# Patient Record
Sex: Female | Born: 1935 | Race: White | Hispanic: No
Health system: Southern US, Community
[De-identification: ages and names within clinical notes are randomized; demographics above are authoritative.]

---

## 2003-10-19 ENCOUNTER — Ambulatory Visit: Payer: Self-pay | Admitting: Internal Medicine

## 2004-08-21 ENCOUNTER — Emergency Department: Payer: Self-pay | Admitting: Emergency Medicine

## 2004-08-22 ENCOUNTER — Ambulatory Visit: Payer: Self-pay | Admitting: Emergency Medicine

## 2004-08-24 ENCOUNTER — Emergency Department: Payer: Self-pay | Admitting: Emergency Medicine

## 2004-09-04 ENCOUNTER — Ambulatory Visit: Payer: Self-pay | Admitting: Internal Medicine

## 2004-12-13 ENCOUNTER — Ambulatory Visit: Payer: Self-pay

## 2004-12-25 ENCOUNTER — Ambulatory Visit: Payer: Self-pay

## 2005-01-21 ENCOUNTER — Ambulatory Visit: Payer: Self-pay | Admitting: Gastroenterology

## 2005-04-25 ENCOUNTER — Ambulatory Visit: Payer: Self-pay | Admitting: Gastroenterology

## 2005-08-02 ENCOUNTER — Inpatient Hospital Stay: Payer: Self-pay | Admitting: Internal Medicine

## 2005-09-02 ENCOUNTER — Emergency Department: Payer: Self-pay | Admitting: Emergency Medicine

## 2006-08-24 IMAGING — CR DG CHEST 1V PORT
1 series · 1 of 1 positions shown · non-contrast
Comparison: none

REASON FOR EXAM: Chest pain  rm 15
COMMENTS:

PROCEDURE:     DXR - DXR PORTABLE CHEST SINGLE VIEW  - September 02, 2005 [DATE]
RESULT:         AP view of the chest shows the lung fields to be clear.  The
heart, mediastinal and osseous structures reveal no significant
abnormalities.

[view not recorded]
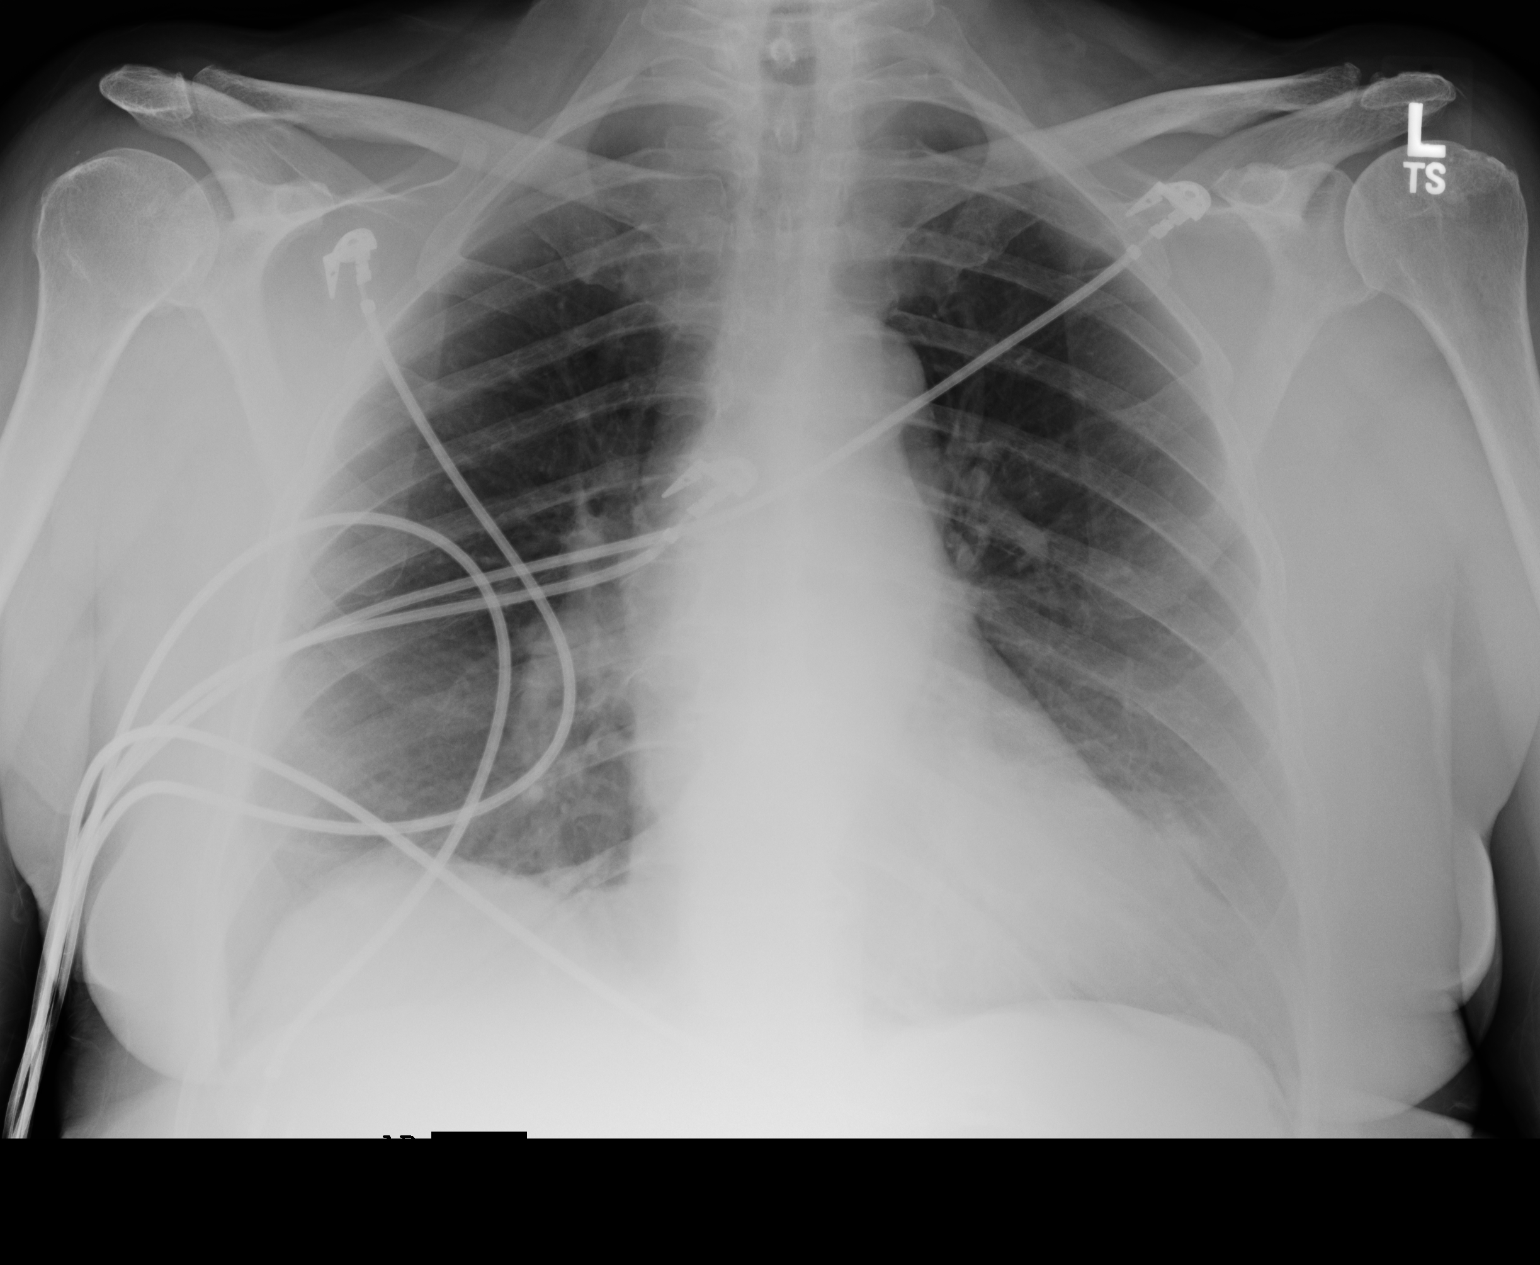

[1 of 1 positions shown; findings below may reference images not displayed]

IMPRESSION: No acute changes are identified.

## 2007-06-11 ENCOUNTER — Ambulatory Visit: Payer: Self-pay | Admitting: Internal Medicine

## 2007-08-09 ENCOUNTER — Emergency Department: Payer: Self-pay | Admitting: Internal Medicine

## 2008-07-13 ENCOUNTER — Ambulatory Visit: Payer: Self-pay | Admitting: Internal Medicine

## 2008-07-14 ENCOUNTER — Ambulatory Visit: Payer: Self-pay | Admitting: Internal Medicine

## 2008-08-05 ENCOUNTER — Ambulatory Visit: Payer: Self-pay | Admitting: Internal Medicine

## 2008-08-14 ENCOUNTER — Ambulatory Visit: Payer: Self-pay | Admitting: Internal Medicine

## 2008-09-23 ENCOUNTER — Ambulatory Visit: Payer: Self-pay | Admitting: Nephrology

## 2009-01-02 ENCOUNTER — Ambulatory Visit: Payer: Self-pay | Admitting: Internal Medicine

## 2009-01-11 ENCOUNTER — Ambulatory Visit: Payer: Self-pay | Admitting: Orthopedic Surgery

## 2009-03-30 ENCOUNTER — Ambulatory Visit: Payer: Self-pay | Admitting: Internal Medicine

## 2009-09-14 IMAGING — US US RENAL KIDNEY
1 series · 17 of 25 positions shown · non-contrast
Comparison: none

REASON FOR EXAM: ckd 4
COMMENTS:

[Series 1: us renal kidney · 17 of 36 slices shown]
[im 1/36]
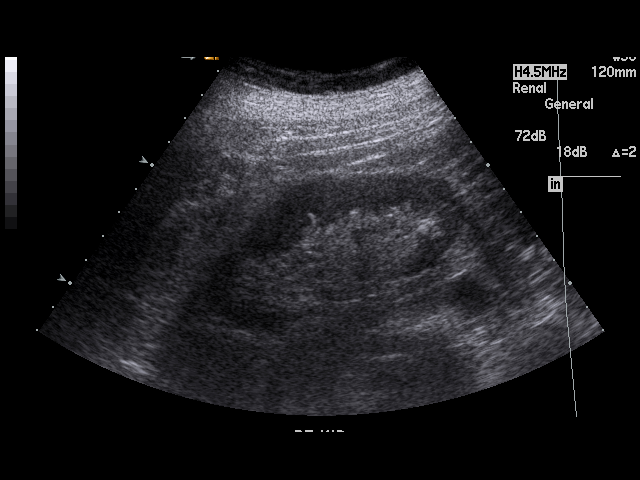
[im 3/36]
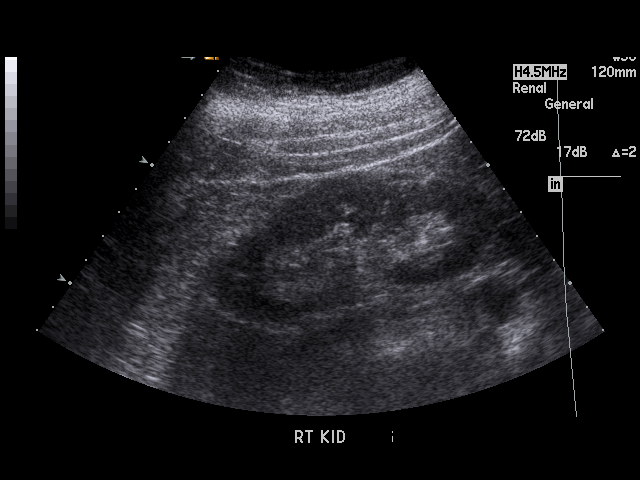
[im 5/36]
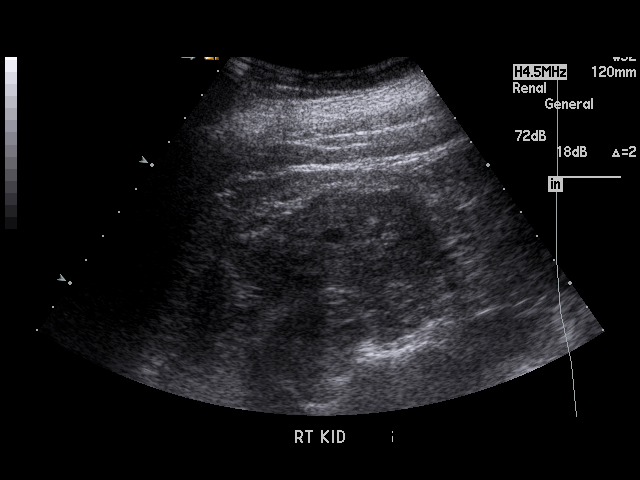
[im 8/36]
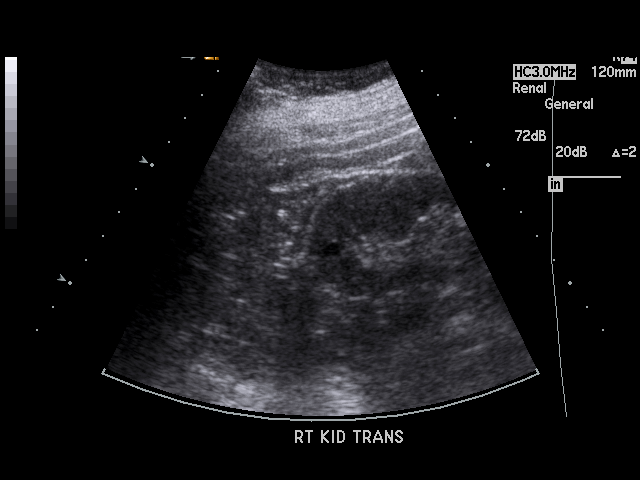
[im 9/36]
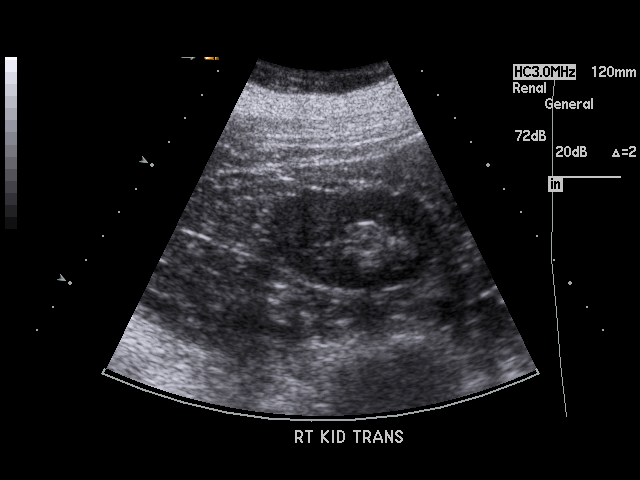
[im 12/36]
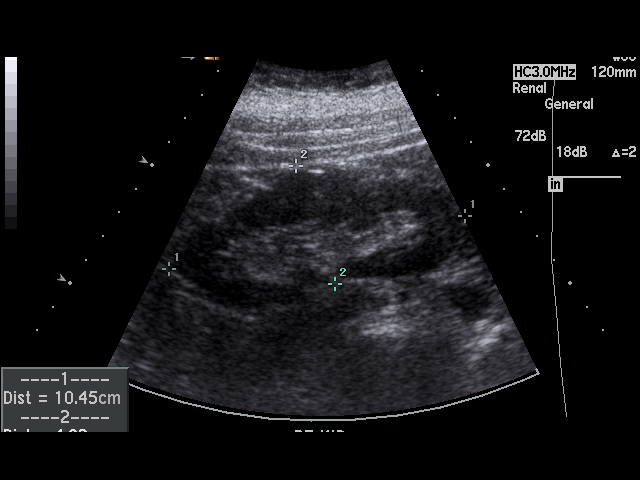
[im 14/36]
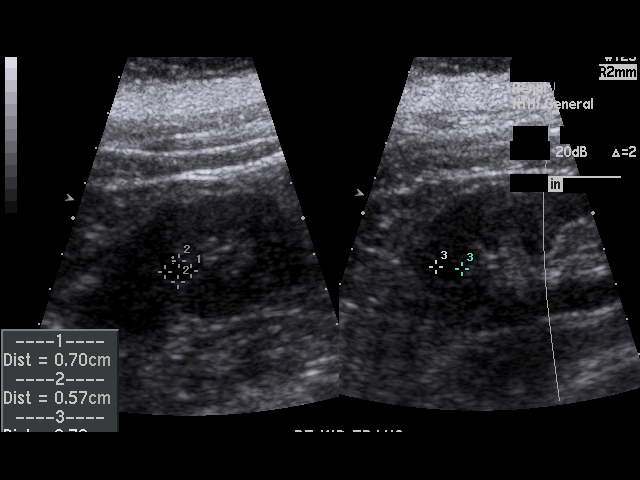
[im 17/36]
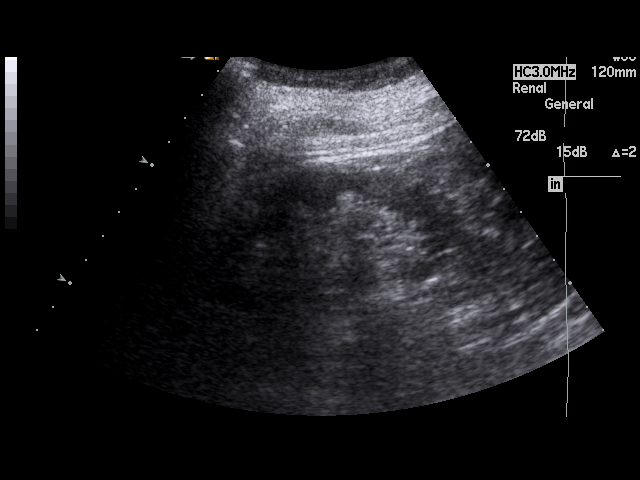
[im 18/36]
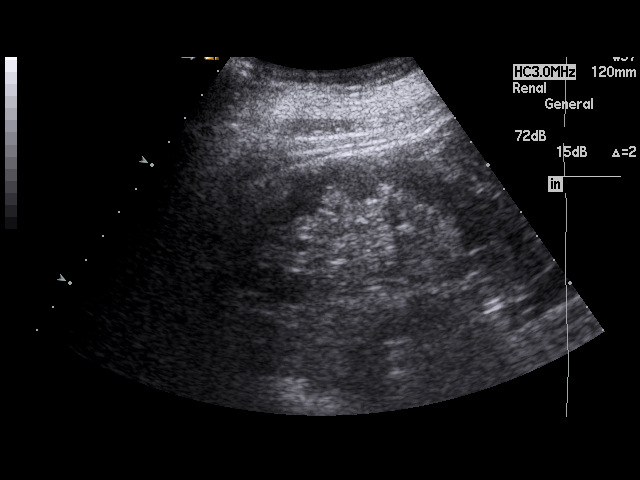
[im 19/36]
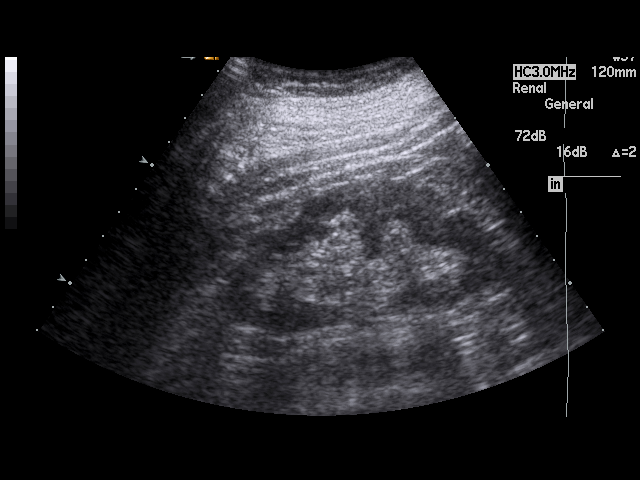
[im 22/36]
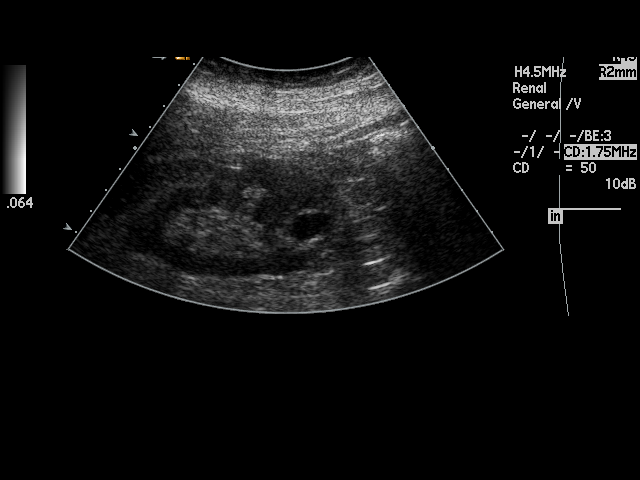
[im 24/36]
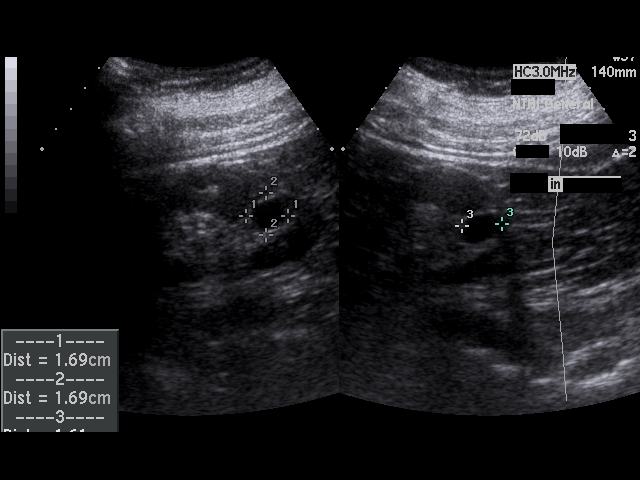
[im 27/36]
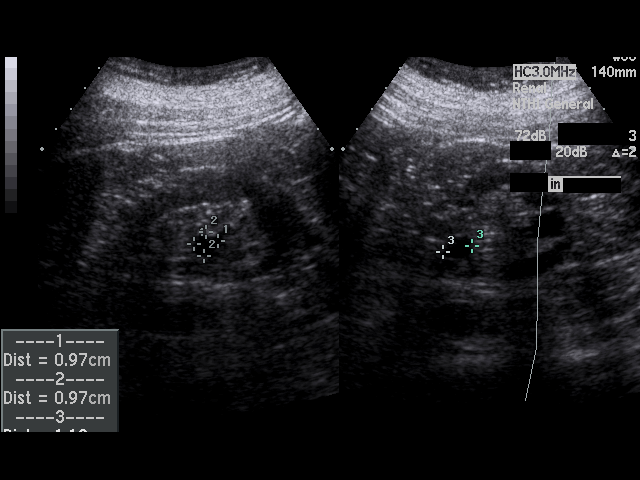
[im 28/36]
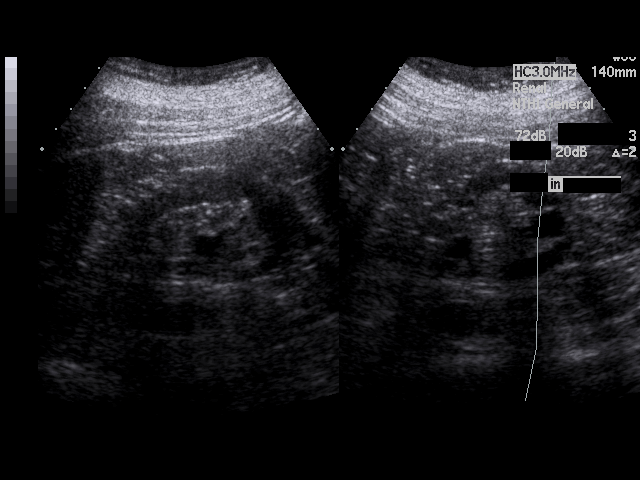
[im 31/36]
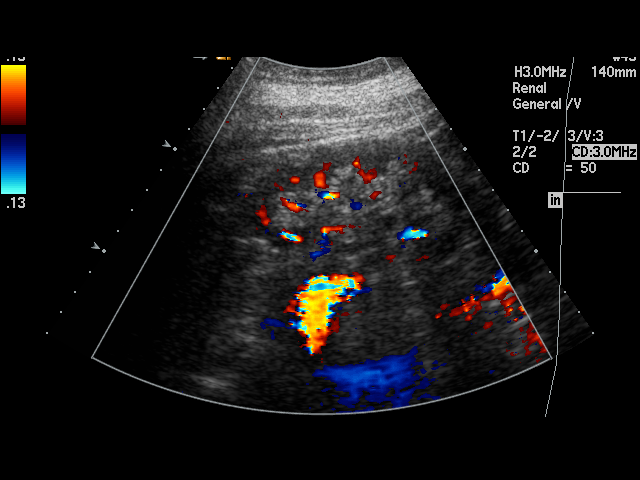
[im 33/36]
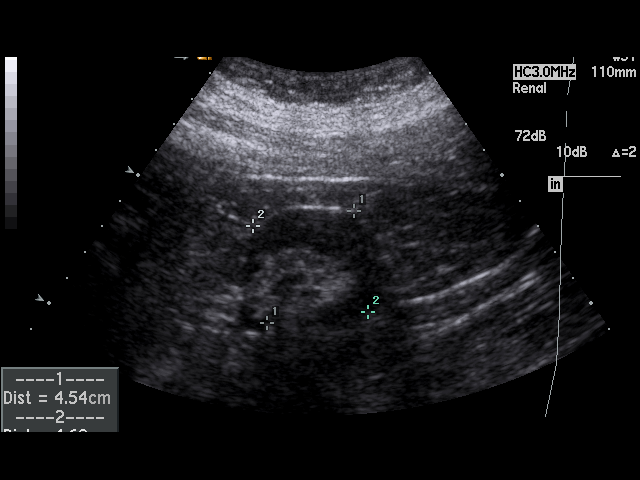
[im 36/36]
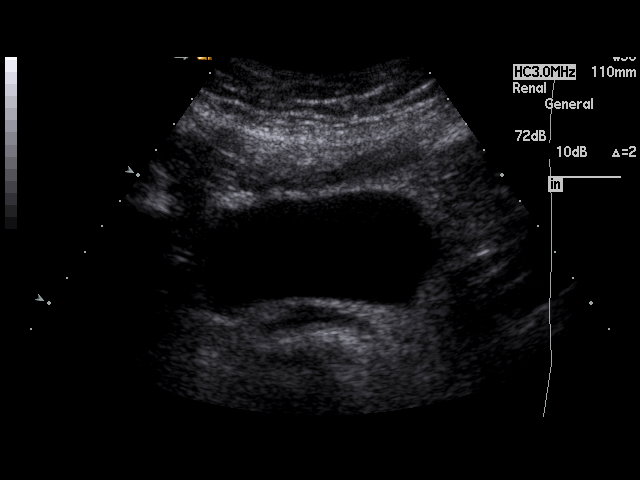

[17 of 25 positions shown; findings below may reference images not displayed]

PROCEDURE:     US  - US KIDNEY  - September 23, 2008  [DATE]

RESULT:     The right kidney measures 10.45 cm x 4.28 cm x 4.4 cm and the
left kidney measures 10.57 cm x 4.5 cm x 4.6 cm. Renal cortical margins are
smooth. There is a mild increase in echogenicity of the medullary portions
of both kidneys compatible with the clinical history of diminished renal
function. No solid renal mass lesions are seen. No renal calcifications are
observed. There is no hydronephrosis. Incidental note is made of a 7 mm cyst
in the right kidney. There are two cysts in the left kidney with the larger
measuring 1.69 cm at maximum diameter and the smaller measuring 1.19 cm at
maximum diameter. The visualized portion of the urinary bladder reveals no
significant abnormalities.
IMPRESSION: No acute changes are identified.

## 2009-11-22 ENCOUNTER — Encounter: Payer: Self-pay | Admitting: Rheumatology

## 2009-12-14 ENCOUNTER — Encounter: Payer: Self-pay | Admitting: Rheumatology

## 2010-03-16 ENCOUNTER — Ambulatory Visit: Payer: Self-pay | Admitting: Rheumatology

## 2011-01-01 ENCOUNTER — Inpatient Hospital Stay: Payer: Self-pay | Admitting: Internal Medicine

## 2011-04-19 ENCOUNTER — Ambulatory Visit: Payer: Self-pay | Admitting: Cardiovascular Disease

## 2011-04-19 LAB — PROTIME-INR
INR: 2.6
Prothrombin Time: 27.9 secs — ABNORMAL HIGH (ref 11.5–14.7)

## 2011-04-19 LAB — GLUCOSE, RANDOM: Glucose: 128 mg/dL — ABNORMAL HIGH (ref 65–99)

## 2011-07-12 ENCOUNTER — Encounter: Payer: Self-pay | Admitting: Internal Medicine

## 2011-07-15 ENCOUNTER — Encounter: Payer: Self-pay | Admitting: Internal Medicine

## 2012-01-27 ENCOUNTER — Inpatient Hospital Stay: Payer: Self-pay | Admitting: Internal Medicine

## 2012-01-27 LAB — BASIC METABOLIC PANEL
BUN: 38 mg/dL — ABNORMAL HIGH (ref 7–18)
Chloride: 102 mmol/L (ref 98–107)
Co2: 32 mmol/L (ref 21–32)
EGFR (Non-African Amer.): 37 — ABNORMAL LOW
Sodium: 136 mmol/L (ref 136–145)

## 2012-01-27 LAB — CK TOTAL AND CKMB (NOT AT ARMC): CK-MB: 2.2 ng/mL (ref 0.5–3.6)

## 2012-01-27 LAB — CBC
HCT: 38.2 % (ref 35.0–47.0)
HGB: 12.5 g/dL (ref 12.0–16.0)
MCH: 32.5 pg (ref 26.0–34.0)
MCHC: 32.8 g/dL (ref 32.0–36.0)
MCV: 99 fL (ref 80–100)
Platelet: 160 10*3/uL (ref 150–440)
WBC: 4.2 10*3/uL (ref 3.6–11.0)

## 2012-01-27 LAB — TROPONIN I: Troponin-I: <0.02 ng/mL

## 2012-01-28 LAB — BASIC METABOLIC PANEL
Anion Gap: 5 — ABNORMAL LOW (ref 7–16)
Calcium, Total: 9.8 mg/dL (ref 8.5–10.1)
Chloride: 101 mmol/L (ref 98–107)
Creatinine: 1.51 mg/dL — ABNORMAL HIGH (ref 0.60–1.30)
EGFR (Non-African Amer.): 33 — ABNORMAL LOW
Glucose: 189 mg/dL — ABNORMAL HIGH (ref 65–99)
Osmolality: 287 (ref 275–301)
Sodium: 136 mmol/L (ref 136–145)

## 2012-01-28 LAB — CREATININE, URINE, RANDOM: Creatinine, Urine Random: 29 mg/dL — ABNORMAL LOW (ref 30.0–125.0)

## 2012-01-28 LAB — PROTIME-INR: INR: 2

## 2012-01-29 LAB — CBC WITH DIFFERENTIAL/PLATELET
Basophil #: 0 10*3/uL (ref 0.0–0.1)
Eosinophil %: 0 %
HCT: 36.6 % (ref 35.0–47.0)
HGB: 12.7 g/dL (ref 12.0–16.0)
Lymphocyte #: 0.8 10*3/uL — ABNORMAL LOW (ref 1.0–3.6)
Lymphocyte %: 9.7 %
MCHC: 34.6 g/dL (ref 32.0–36.0)
MCV: 98 fL (ref 80–100)
Monocyte #: 0.5 x10 3/mm (ref 0.2–0.9)
Neutrophil #: 7.4 10*3/uL — ABNORMAL HIGH (ref 1.4–6.5)
Platelet: 186 10*3/uL (ref 150–440)

## 2012-01-29 LAB — BASIC METABOLIC PANEL
Anion Gap: 3 — ABNORMAL LOW (ref 7–16)
Chloride: 97 mmol/L — ABNORMAL LOW (ref 98–107)
Co2: 33 mmol/L — ABNORMAL HIGH (ref 21–32)
Creatinine: 1.65 mg/dL — ABNORMAL HIGH (ref 0.60–1.30)
EGFR (Non-African Amer.): 29 — ABNORMAL LOW
Glucose: 212 mg/dL — ABNORMAL HIGH (ref 65–99)
Potassium: 5.4 mmol/L — ABNORMAL HIGH (ref 3.5–5.1)
Sodium: 133 mmol/L — ABNORMAL LOW (ref 136–145)

## 2012-01-30 LAB — PROTIME-INR
INR: 2.6
Prothrombin Time: 27.8 secs — ABNORMAL HIGH (ref 11.5–14.7)

## 2012-01-30 LAB — BASIC METABOLIC PANEL
Anion Gap: 5 — ABNORMAL LOW (ref 7–16)
Calcium, Total: 9.3 mg/dL (ref 8.5–10.1)
Creatinine: 1.42 mg/dL — ABNORMAL HIGH (ref 0.60–1.30)
EGFR (African American): 41 — ABNORMAL LOW
Glucose: 179 mg/dL — ABNORMAL HIGH (ref 65–99)
Potassium: 4.6 mmol/L (ref 3.5–5.1)

## 2012-01-31 LAB — BASIC METABOLIC PANEL
BUN: 48 mg/dL — ABNORMAL HIGH (ref 7–18)
Calcium, Total: 9.9 mg/dL (ref 8.5–10.1)
Chloride: 97 mmol/L — ABNORMAL LOW (ref 98–107)
Creatinine: 1.22 mg/dL (ref 0.60–1.30)

## 2012-01-31 LAB — PROTIME-INR
INR: 2.6
Prothrombin Time: 28 secs — ABNORMAL HIGH (ref 11.5–14.7)

## 2012-02-02 LAB — CULTURE, BLOOD (SINGLE)

## 2012-06-10 ENCOUNTER — Encounter: Payer: Self-pay | Admitting: Cardiothoracic Surgery

## 2012-06-10 ENCOUNTER — Encounter: Payer: Self-pay | Admitting: Nurse Practitioner

## 2012-06-14 ENCOUNTER — Encounter: Payer: Self-pay | Admitting: Nurse Practitioner

## 2012-06-14 ENCOUNTER — Encounter: Payer: Self-pay | Admitting: Cardiothoracic Surgery

## 2012-08-10 ENCOUNTER — Inpatient Hospital Stay: Payer: Self-pay | Admitting: Internal Medicine

## 2012-08-10 LAB — URINALYSIS, COMPLETE
Bilirubin,UR: NEGATIVE
Blood: NEGATIVE
Ph: 5 (ref 4.5–8.0)
RBC,UR: 2 /HPF (ref 0–5)
Specific Gravity: 1.006 (ref 1.003–1.030)
Squamous Epithelial: 1
WBC UR: 10 /HPF (ref 0–5)

## 2012-08-10 LAB — COMPREHENSIVE METABOLIC PANEL
Albumin: 2.8 g/dL — ABNORMAL LOW (ref 3.4–5.0)
Alkaline Phosphatase: 50 U/L (ref 50–136)
Anion Gap: 2 — ABNORMAL LOW (ref 7–16)
Calcium, Total: 9 mg/dL (ref 8.5–10.1)
Chloride: 98 mmol/L (ref 98–107)
Creatinine: 1.36 mg/dL — ABNORMAL HIGH (ref 0.60–1.30)
EGFR (African American): 43 — ABNORMAL LOW
EGFR (Non-African Amer.): 37 — ABNORMAL LOW
Glucose: 157 mg/dL — ABNORMAL HIGH (ref 65–99)
Osmolality: 284 (ref 275–301)
Potassium: 3.2 mmol/L — ABNORMAL LOW (ref 3.5–5.1)
SGOT(AST): 38 U/L — ABNORMAL HIGH (ref 15–37)
SGPT (ALT): 32 U/L (ref 12–78)
Sodium: 138 mmol/L (ref 136–145)
Total Protein: 6.8 g/dL (ref 6.4–8.2)

## 2012-08-10 LAB — CBC WITH DIFFERENTIAL/PLATELET
Eosinophil #: 0 10*3/uL (ref 0.0–0.7)
HGB: 12.4 g/dL (ref 12.0–16.0)
Lymphocyte #: 2.1 10*3/uL (ref 1.0–3.6)
MCH: 32.7 pg (ref 26.0–34.0)
MCHC: 34.4 g/dL (ref 32.0–36.0)
MCV: 95 fL (ref 80–100)
Monocyte #: 1 x10 3/mm — ABNORMAL HIGH (ref 0.2–0.9)
Monocyte %: 21.8 %
Platelet: 97 10*3/uL — ABNORMAL LOW (ref 150–440)

## 2012-08-10 LAB — PRO B NATRIURETIC PEPTIDE: B-Type Natriuretic Peptide: 981 pg/mL — ABNORMAL HIGH (ref 0–450)

## 2012-08-10 LAB — PROTIME-INR
INR: 2.5
Prothrombin Time: 26.6 secs — ABNORMAL HIGH (ref 11.5–14.7)

## 2012-08-10 LAB — APTT: Activated PTT: 51.6 secs — ABNORMAL HIGH (ref 23.6–35.9)

## 2012-08-11 LAB — COMPREHENSIVE METABOLIC PANEL
Albumin: 2.9 g/dL — ABNORMAL LOW (ref 3.4–5.0)
BUN: 25 mg/dL — ABNORMAL HIGH (ref 7–18)
Calcium, Total: 9.2 mg/dL (ref 8.5–10.1)
Chloride: 94 mmol/L — ABNORMAL LOW (ref 98–107)
Co2: 42 mmol/L (ref 21–32)
Creatinine: 1.4 mg/dL — ABNORMAL HIGH (ref 0.60–1.30)
Glucose: 161 mg/dL — ABNORMAL HIGH (ref 65–99)
SGOT(AST): 33 U/L (ref 15–37)
SGPT (ALT): 33 U/L (ref 12–78)
Total Protein: 7.2 g/dL (ref 6.4–8.2)

## 2012-08-11 LAB — CK-MB: CK-MB: 3.3 ng/mL (ref 0.5–3.6)

## 2012-08-11 LAB — CBC WITH DIFFERENTIAL/PLATELET
Basophil #: 0.1 10*3/uL (ref 0.0–0.1)
Eosinophil #: 0.1 10*3/uL (ref 0.0–0.7)
Eosinophil %: 1.9 %
HCT: 36.2 % (ref 35.0–47.0)
Lymphocyte #: 2.3 10*3/uL (ref 1.0–3.6)
Lymphocyte %: 45.4 %
MCH: 31.3 pg (ref 26.0–34.0)
MCHC: 33.1 g/dL (ref 32.0–36.0)
Monocyte %: 17.2 %
Platelet: 105 10*3/uL — ABNORMAL LOW (ref 150–440)

## 2012-08-11 LAB — PROTIME-INR: INR: 2.6

## 2012-08-11 LAB — LIPID PANEL
HDL Cholesterol: 62 mg/dL — ABNORMAL HIGH (ref 40–60)
Ldl Cholesterol, Calc: 36 mg/dL (ref 0–100)
Triglycerides: 100 mg/dL (ref 0–200)

## 2012-08-12 LAB — BASIC METABOLIC PANEL
Anion Gap: 3 — ABNORMAL LOW (ref 7–16)
Chloride: 95 mmol/L — ABNORMAL LOW (ref 98–107)
Creatinine: 1.44 mg/dL — ABNORMAL HIGH (ref 0.60–1.30)
EGFR (African American): 40 — ABNORMAL LOW
Potassium: 3.5 mmol/L (ref 3.5–5.1)

## 2012-08-12 LAB — PROTIME-INR: Prothrombin Time: 30.9 secs — ABNORMAL HIGH (ref 11.5–14.7)

## 2012-08-13 LAB — BASIC METABOLIC PANEL
Anion Gap: 6 — ABNORMAL LOW (ref 7–16)
BUN: 31 mg/dL — ABNORMAL HIGH (ref 7–18)
Calcium, Total: 9.7 mg/dL (ref 8.5–10.1)
Co2: 42 mmol/L (ref 21–32)
Creatinine: 1.46 mg/dL — ABNORMAL HIGH (ref 0.60–1.30)
EGFR (African American): 39 — ABNORMAL LOW
Glucose: 108 mg/dL — ABNORMAL HIGH (ref 65–99)
Potassium: 3.4 mmol/L — ABNORMAL LOW (ref 3.5–5.1)
Sodium: 139 mmol/L (ref 136–145)

## 2012-08-13 LAB — URINE CULTURE

## 2012-08-14 LAB — BASIC METABOLIC PANEL
Anion Gap: 1 — ABNORMAL LOW (ref 7–16)
BUN: 38 mg/dL — ABNORMAL HIGH (ref 7–18)
Calcium, Total: 9.3 mg/dL (ref 8.5–10.1)
Chloride: 96 mmol/L — ABNORMAL LOW (ref 98–107)
Creatinine: 1.62 mg/dL — ABNORMAL HIGH (ref 0.60–1.30)
EGFR (Non-African Amer.): 30 — ABNORMAL LOW
Glucose: 96 mg/dL (ref 65–99)
Osmolality: 286 (ref 275–301)
Potassium: 3.4 mmol/L — ABNORMAL LOW (ref 3.5–5.1)

## 2012-08-14 LAB — URINALYSIS, COMPLETE
Blood: NEGATIVE
Glucose,UR: NEGATIVE mg/dL (ref 0–75)
Leukocyte Esterase: NEGATIVE
Nitrite: NEGATIVE
RBC,UR: NONE SEEN /HPF (ref 0–5)

## 2012-08-14 LAB — PROTIME-INR: Prothrombin Time: 25.3 secs — ABNORMAL HIGH (ref 11.5–14.7)

## 2012-08-15 LAB — CBC WITH DIFFERENTIAL/PLATELET
Basophil %: 1 %
Eosinophil #: 0.2 10*3/uL (ref 0.0–0.7)
Eosinophil %: 3.6 %
HCT: 32.2 % — ABNORMAL LOW (ref 35.0–47.0)
Lymphocyte %: 34.8 %
MCH: 32.3 pg (ref 26.0–34.0)
MCHC: 34.2 g/dL (ref 32.0–36.0)
Monocyte #: 0.6 x10 3/mm (ref 0.2–0.9)
Monocyte %: 11.5 %
Neutrophil #: 2.5 10*3/uL (ref 1.4–6.5)
Neutrophil %: 49.1 %
RDW: 13.4 % (ref 11.5–14.5)
WBC: 5 10*3/uL (ref 3.6–11.0)

## 2012-08-15 LAB — PROTIME-INR
INR: 2.3
Prothrombin Time: 24.7 secs — ABNORMAL HIGH (ref 11.5–14.7)

## 2012-08-15 LAB — HEMOGLOBIN: HGB: 11 g/dL — ABNORMAL LOW (ref 12.0–16.0)

## 2012-08-16 LAB — PROTIME-INR
INR: 2.3
Prothrombin Time: 24.4 secs — ABNORMAL HIGH (ref 11.5–14.7)

## 2012-08-16 LAB — CULTURE, BLOOD (SINGLE)

## 2012-12-28 ENCOUNTER — Ambulatory Visit: Payer: Self-pay | Admitting: Cardiology

## 2012-12-28 LAB — CBC WITH DIFFERENTIAL/PLATELET
Basophil %: 0.5 %
Eosinophil #: 0.1 10*3/uL (ref 0.0–0.7)
HCT: 38.5 % (ref 35.0–47.0)
HGB: 12.6 g/dL (ref 12.0–16.0)
Lymphocyte #: 2 10*3/uL (ref 1.0–3.6)
MCHC: 32.7 g/dL (ref 32.0–36.0)
MCV: 97 fL (ref 80–100)
Monocyte #: 0.7 x10 3/mm (ref 0.2–0.9)
Monocyte %: 10.9 %
Neutrophil #: 3.8 10*3/uL (ref 1.4–6.5)
RBC: 3.98 10*6/uL (ref 3.80–5.20)
RDW: 13.5 % (ref 11.5–14.5)

## 2012-12-28 LAB — URINALYSIS, COMPLETE
Glucose,UR: NEGATIVE mg/dL (ref 0–75)
Nitrite: NEGATIVE
Ph: 5 (ref 4.5–8.0)
Protein: 30
RBC,UR: 3 /HPF (ref 0–5)
Specific Gravity: 1.01 (ref 1.003–1.030)
WBC UR: 15 /HPF (ref 0–5)

## 2012-12-28 LAB — BASIC METABOLIC PANEL
Calcium, Total: 9.8 mg/dL (ref 8.5–10.1)
Creatinine: 1.91 mg/dL — ABNORMAL HIGH (ref 0.60–1.30)
EGFR (African American): 28 — ABNORMAL LOW
EGFR (Non-African Amer.): 24 — ABNORMAL LOW
Osmolality: 288 (ref 275–301)
Potassium: 3.7 mmol/L (ref 3.5–5.1)
Sodium: 139 mmol/L (ref 136–145)

## 2012-12-28 LAB — PROTIME-INR
INR: 1.9
Prothrombin Time: 21.4 secs — ABNORMAL HIGH (ref 11.5–14.7)

## 2012-12-28 LAB — APTT: Activated PTT: 33.7 secs (ref 23.6–35.9)

## 2013-01-01 ENCOUNTER — Observation Stay: Payer: Self-pay | Admitting: Internal Medicine

## 2013-01-02 LAB — PROTIME-INR: INR: 1.8

## 2013-01-03 LAB — PROTIME-INR
INR: 2.2
Prothrombin Time: 23.6 secs — ABNORMAL HIGH (ref 11.5–14.7)

## 2013-04-16 ENCOUNTER — Inpatient Hospital Stay: Payer: Self-pay | Admitting: Emergency Medicine

## 2013-04-16 LAB — COMPREHENSIVE METABOLIC PANEL
Albumin: 2.8 g/dL — ABNORMAL LOW (ref 3.4–5.0)
Alkaline Phosphatase: 41 U/L — ABNORMAL LOW
Anion Gap: 6 — ABNORMAL LOW (ref 7–16)
BILIRUBIN TOTAL: 0.6 mg/dL (ref 0.2–1.0)
BUN: 30 mg/dL — ABNORMAL HIGH (ref 7–18)
Calcium, Total: 9.5 mg/dL (ref 8.5–10.1)
Chloride: 95 mmol/L — ABNORMAL LOW (ref 98–107)
Co2: 34 mmol/L — ABNORMAL HIGH (ref 21–32)
Creatinine: 1.43 mg/dL — ABNORMAL HIGH (ref 0.60–1.30)
EGFR (African American): 40 — ABNORMAL LOW
EGFR (Non-African Amer.): 35 — ABNORMAL LOW
Glucose: 86 mg/dL (ref 65–99)
Osmolality: 276 (ref 275–301)
POTASSIUM: 3.9 mmol/L (ref 3.5–5.1)
SGOT(AST): 70 U/L — ABNORMAL HIGH (ref 15–37)
SGPT (ALT): 39 U/L (ref 12–78)
SODIUM: 135 mmol/L — AB (ref 136–145)
TOTAL PROTEIN: 7 g/dL (ref 6.4–8.2)

## 2013-04-16 LAB — URINALYSIS, COMPLETE
BILIRUBIN, UR: NEGATIVE
BLOOD: NEGATIVE
Glucose,UR: NEGATIVE mg/dL (ref 0–75)
Ketone: NEGATIVE
Nitrite: POSITIVE
Ph: 6 (ref 4.5–8.0)
Protein: 30
RBC, UR: NONE SEEN /HPF (ref 0–5)
Specific Gravity: 1.01 (ref 1.003–1.030)
Squamous Epithelial: 1
WBC UR: 222 /HPF (ref 0–5)

## 2013-04-16 LAB — CBC
HCT: 34.2 % — ABNORMAL LOW (ref 35.0–47.0)
HGB: 11.2 g/dL — AB (ref 12.0–16.0)
MCH: 32.5 pg (ref 26.0–34.0)
MCHC: 32.8 g/dL (ref 32.0–36.0)
MCV: 99 fL (ref 80–100)
Platelet: 186 10*3/uL (ref 150–440)
RBC: 3.46 10*6/uL — AB (ref 3.80–5.20)
RDW: 13.4 % (ref 11.5–14.5)
WBC: 4.6 10*3/uL (ref 3.6–11.0)

## 2013-04-16 LAB — LIPASE, BLOOD: Lipase: 87 U/L (ref 73–393)

## 2013-04-16 LAB — TROPONIN I: Troponin-I: <0.02 ng/mL

## 2013-04-16 LAB — PRO B NATRIURETIC PEPTIDE: B-TYPE NATIURETIC PEPTID: 1001 pg/mL — AB (ref 0–450)

## 2013-04-16 LAB — PROTIME-INR
INR: 2.2
Prothrombin Time: 23.9 secs — ABNORMAL HIGH (ref 11.5–14.7)

## 2013-04-17 LAB — BASIC METABOLIC PANEL
Anion Gap: 2 — ABNORMAL LOW (ref 7–16)
BUN: 25 mg/dL — ABNORMAL HIGH (ref 7–18)
CHLORIDE: 101 mmol/L (ref 98–107)
CO2: 37 mmol/L — AB (ref 21–32)
CREATININE: 1.51 mg/dL — AB (ref 0.60–1.30)
Calcium, Total: 8.8 mg/dL (ref 8.5–10.1)
EGFR (African American): 38 — ABNORMAL LOW
GFR CALC NON AF AMER: 33 — AB
Glucose: 89 mg/dL (ref 65–99)
OSMOLALITY: 283 (ref 275–301)
POTASSIUM: 3 mmol/L — AB (ref 3.5–5.1)
Sodium: 140 mmol/L (ref 136–145)

## 2013-04-17 LAB — CBC WITH DIFFERENTIAL/PLATELET
BASOS ABS: 0.1 10*3/uL (ref 0.0–0.1)
Basophil %: 0.8 %
EOS ABS: 0 10*3/uL (ref 0.0–0.7)
Eosinophil %: 0.3 %
HCT: 33 % — ABNORMAL LOW (ref 35.0–47.0)
HGB: 10.9 g/dL — ABNORMAL LOW (ref 12.0–16.0)
LYMPHS ABS: 1 10*3/uL (ref 1.0–3.6)
LYMPHS PCT: 16.2 %
MCH: 32.9 pg (ref 26.0–34.0)
MCHC: 33 g/dL (ref 32.0–36.0)
MCV: 100 fL (ref 80–100)
MONO ABS: 0.8 x10 3/mm (ref 0.2–0.9)
MONOS PCT: 12.7 %
Neutrophil #: 4.5 10*3/uL (ref 1.4–6.5)
Neutrophil %: 70 %
PLATELETS: 190 10*3/uL (ref 150–440)
RBC: 3.3 10*6/uL — AB (ref 3.80–5.20)
RDW: 13.6 % (ref 11.5–14.5)
WBC: 6.4 10*3/uL (ref 3.6–11.0)

## 2013-04-17 LAB — PROTIME-INR
INR: 2.4
Prothrombin Time: 25.4 secs — ABNORMAL HIGH (ref 11.5–14.7)

## 2013-04-18 LAB — BASIC METABOLIC PANEL
ANION GAP: 2 — AB (ref 7–16)
BUN: 22 mg/dL — ABNORMAL HIGH (ref 7–18)
CALCIUM: 8.3 mg/dL — AB (ref 8.5–10.1)
CREATININE: 1.49 mg/dL — AB (ref 0.60–1.30)
Chloride: 103 mmol/L (ref 98–107)
Co2: 35 mmol/L — ABNORMAL HIGH (ref 21–32)
EGFR (African American): 38 — ABNORMAL LOW
GFR CALC NON AF AMER: 33 — AB
GLUCOSE: 123 mg/dL — AB (ref 65–99)
Osmolality: 284 (ref 275–301)
Potassium: 3.5 mmol/L (ref 3.5–5.1)
Sodium: 140 mmol/L (ref 136–145)

## 2013-04-18 LAB — CBC WITH DIFFERENTIAL/PLATELET
BASOS ABS: 0 10*3/uL (ref 0.0–0.1)
Basophil %: 0.4 %
EOS PCT: 3.2 %
Eosinophil #: 0.2 10*3/uL (ref 0.0–0.7)
HCT: 31.1 % — ABNORMAL LOW (ref 35.0–47.0)
HGB: 10.3 g/dL — ABNORMAL LOW (ref 12.0–16.0)
LYMPHS ABS: 1 10*3/uL (ref 1.0–3.6)
Lymphocyte %: 18.5 %
MCH: 33.1 pg (ref 26.0–34.0)
MCHC: 33.2 g/dL (ref 32.0–36.0)
MCV: 100 fL (ref 80–100)
Monocyte #: 0.6 x10 3/mm (ref 0.2–0.9)
Monocyte %: 10.7 %
Neutrophil #: 3.5 10*3/uL (ref 1.4–6.5)
Neutrophil %: 67.2 %
Platelet: 187 10*3/uL (ref 150–440)
RBC: 3.11 10*6/uL — ABNORMAL LOW (ref 3.80–5.20)
RDW: 13.4 % (ref 11.5–14.5)
WBC: 5.3 10*3/uL (ref 3.6–11.0)

## 2013-04-18 LAB — PROTIME-INR
INR: 3.1
Prothrombin Time: 31.2 secs — ABNORMAL HIGH (ref 11.5–14.7)

## 2013-04-18 LAB — CLOSTRIDIUM DIFFICILE(ARMC)

## 2013-04-18 LAB — MAGNESIUM: MAGNESIUM: 1.6 mg/dL — AB

## 2013-04-18 LAB — OCCULT BLOOD X 1 CARD TO LAB, STOOL: OCCULT BLOOD, FECES: POSITIVE

## 2013-04-19 LAB — BASIC METABOLIC PANEL
Anion Gap: 3 — ABNORMAL LOW (ref 7–16)
BUN: 20 mg/dL — AB (ref 7–18)
Calcium, Total: 8.8 mg/dL (ref 8.5–10.1)
Chloride: 102 mmol/L (ref 98–107)
Co2: 32 mmol/L (ref 21–32)
Creatinine: 1.45 mg/dL — ABNORMAL HIGH (ref 0.60–1.30)
EGFR (African American): 40 — ABNORMAL LOW
GFR CALC NON AF AMER: 34 — AB
GLUCOSE: 154 mg/dL — AB (ref 65–99)
OSMOLALITY: 280 (ref 275–301)
POTASSIUM: 4.4 mmol/L (ref 3.5–5.1)
Sodium: 137 mmol/L (ref 136–145)

## 2013-04-19 LAB — URINE CULTURE

## 2013-04-19 LAB — PROTIME-INR
INR: 4
PROTHROMBIN TIME: 37.8 s — AB (ref 11.5–14.7)

## 2013-04-19 LAB — MAGNESIUM: Magnesium: 2 mg/dL

## 2013-04-20 LAB — CBC WITH DIFFERENTIAL/PLATELET
Basophil #: 0.1 10*3/uL (ref 0.0–0.1)
Basophil %: 0.5 %
Eosinophil #: 0 10*3/uL (ref 0.0–0.7)
Eosinophil %: 0.3 %
HCT: 33.3 % — ABNORMAL LOW (ref 35.0–47.0)
HGB: 11.1 g/dL — AB (ref 12.0–16.0)
Lymphocyte #: 0.7 10*3/uL — ABNORMAL LOW (ref 1.0–3.6)
Lymphocyte %: 7.4 %
MCH: 33.3 pg (ref 26.0–34.0)
MCHC: 33.3 g/dL (ref 32.0–36.0)
MCV: 100 fL (ref 80–100)
Monocyte #: 0.8 x10 3/mm (ref 0.2–0.9)
Monocyte %: 7.7 %
NEUTROS ABS: 8.2 10*3/uL — AB (ref 1.4–6.5)
Neutrophil %: 84.1 %
PLATELETS: 224 10*3/uL (ref 150–440)
RBC: 3.33 10*6/uL — AB (ref 3.80–5.20)
RDW: 13.9 % (ref 11.5–14.5)
WBC: 9.8 10*3/uL (ref 3.6–11.0)

## 2013-04-20 LAB — COMPREHENSIVE METABOLIC PANEL
ALK PHOS: 49 U/L
AST: 43 U/L — AB (ref 15–37)
Albumin: 2.7 g/dL — ABNORMAL LOW (ref 3.4–5.0)
Anion Gap: 1 — ABNORMAL LOW (ref 7–16)
BILIRUBIN TOTAL: 0.3 mg/dL (ref 0.2–1.0)
BUN: 23 mg/dL — ABNORMAL HIGH (ref 7–18)
CO2: 33 mmol/L — AB (ref 21–32)
Calcium, Total: 8.8 mg/dL (ref 8.5–10.1)
Chloride: 100 mmol/L (ref 98–107)
Creatinine: 1.43 mg/dL — ABNORMAL HIGH (ref 0.60–1.30)
EGFR (African American): 40 — ABNORMAL LOW
GFR CALC NON AF AMER: 35 — AB
Glucose: 198 mg/dL — ABNORMAL HIGH (ref 65–99)
OSMOLALITY: 277 (ref 275–301)
Potassium: 4.7 mmol/L (ref 3.5–5.1)
SGPT (ALT): 45 U/L (ref 12–78)
SODIUM: 134 mmol/L — AB (ref 136–145)
TOTAL PROTEIN: 7 g/dL (ref 6.4–8.2)

## 2013-04-20 LAB — PROTIME-INR
INR: 4.5 — AB
PROTHROMBIN TIME: 41.4 s — AB (ref 11.5–14.7)

## 2013-04-21 LAB — CULTURE, BLOOD (SINGLE)

## 2013-04-21 LAB — PROTIME-INR
INR: 4.1
PROTHROMBIN TIME: 38.6 s — AB (ref 11.5–14.7)

## 2013-04-22 LAB — COMPREHENSIVE METABOLIC PANEL
ALK PHOS: 45 U/L
ANION GAP: 2 — AB (ref 7–16)
Albumin: 2.5 g/dL — ABNORMAL LOW (ref 3.4–5.0)
BUN: 37 mg/dL — AB (ref 7–18)
Bilirubin,Total: 0.4 mg/dL (ref 0.2–1.0)
CHLORIDE: 100 mmol/L (ref 98–107)
CO2: 32 mmol/L (ref 21–32)
Calcium, Total: 9.2 mg/dL (ref 8.5–10.1)
Creatinine: 1.42 mg/dL — ABNORMAL HIGH (ref 0.60–1.30)
EGFR (African American): 41 — ABNORMAL LOW
EGFR (Non-African Amer.): 35 — ABNORMAL LOW
Glucose: 164 mg/dL — ABNORMAL HIGH (ref 65–99)
Osmolality: 281 (ref 275–301)
Potassium: 5.3 mmol/L — ABNORMAL HIGH (ref 3.5–5.1)
SGOT(AST): 23 U/L (ref 15–37)
SGPT (ALT): 32 U/L (ref 12–78)
Sodium: 134 mmol/L — ABNORMAL LOW (ref 136–145)
Total Protein: 6.8 g/dL (ref 6.4–8.2)

## 2013-04-22 LAB — CBC WITH DIFFERENTIAL/PLATELET
BASOS ABS: 0 10*3/uL (ref 0.0–0.1)
Basophil %: 0.5 %
Eosinophil #: 0.1 10*3/uL (ref 0.0–0.7)
Eosinophil %: 1 %
HCT: 29.2 % — ABNORMAL LOW (ref 35.0–47.0)
HGB: 9.6 g/dL — ABNORMAL LOW (ref 12.0–16.0)
Lymphocyte #: 1.2 10*3/uL (ref 1.0–3.6)
Lymphocyte %: 16.6 %
MCH: 32.5 pg (ref 26.0–34.0)
MCHC: 32.8 g/dL (ref 32.0–36.0)
MCV: 99 fL (ref 80–100)
MONOS PCT: 8 %
Monocyte #: 0.6 x10 3/mm (ref 0.2–0.9)
NEUTROS ABS: 5.4 10*3/uL (ref 1.4–6.5)
Neutrophil %: 73.9 %
Platelet: 199 10*3/uL (ref 150–440)
RBC: 2.94 10*6/uL — AB (ref 3.80–5.20)
RDW: 13.5 % (ref 11.5–14.5)
WBC: 7.3 10*3/uL (ref 3.6–11.0)

## 2013-04-22 LAB — PROTIME-INR
INR: 2.7
Prothrombin Time: 27.7 secs — ABNORMAL HIGH (ref 11.5–14.7)

## 2013-04-23 ENCOUNTER — Encounter: Payer: Self-pay | Admitting: Internal Medicine

## 2013-04-23 LAB — PROTIME-INR
INR: 2
PROTHROMBIN TIME: 22.4 s — AB (ref 11.5–14.7)

## 2013-04-23 LAB — BASIC METABOLIC PANEL
ANION GAP: 2 — AB (ref 7–16)
ANION GAP: 3 — AB (ref 7–16)
Anion Gap: 3 — ABNORMAL LOW (ref 7–16)
BUN: 37 mg/dL — ABNORMAL HIGH (ref 7–18)
BUN: 35 mg/dL — ABNORMAL HIGH (ref 7–18)
BUN: 37 mg/dL — ABNORMAL HIGH (ref 7–18)
CALCIUM: 9.9 mg/dL (ref 8.5–10.1)
CALCIUM: 9.8 mg/dL (ref 8.5–10.1)
CALCIUM: 9.8 mg/dL (ref 8.5–10.1)
CO2: 31 mmol/L (ref 21–32)
CREATININE: 1.53 mg/dL — AB (ref 0.60–1.30)
Chloride: 100 mmol/L (ref 98–107)
Chloride: 102 mmol/L (ref 98–107)
Chloride: 100 mmol/L (ref 98–107)
Co2: 30 mmol/L (ref 21–32)
Co2: 34 mmol/L — ABNORMAL HIGH (ref 21–32)
Creatinine: 1.25 mg/dL (ref 0.60–1.30)
Creatinine: 1.29 mg/dL (ref 0.60–1.30)
EGFR (African American): 37 — ABNORMAL LOW
EGFR (Non-African Amer.): 41 — ABNORMAL LOW
EGFR (Non-African Amer.): 39 — ABNORMAL LOW
EGFR (Non-African Amer.): 32 — ABNORMAL LOW
GFR CALC AF AMER: 47 — AB
GFR CALC AF AMER: 46 — AB
GLUCOSE: 189 mg/dL — AB (ref 65–99)
GLUCOSE: 208 mg/dL — AB (ref 65–99)
GLUCOSE: 228 mg/dL — AB (ref 65–99)
OSMOLALITY: 278 (ref 275–301)
OSMOLALITY: 286 (ref 275–301)
Osmolality: 290 (ref 275–301)
POTASSIUM: 6.4 mmol/L — AB (ref 3.5–5.1)
POTASSIUM: 5.4 mmol/L — AB (ref 3.5–5.1)
Potassium: 5.5 mmol/L — ABNORMAL HIGH (ref 3.5–5.1)
SODIUM: 137 mmol/L (ref 136–145)
Sodium: 132 mmol/L — ABNORMAL LOW (ref 136–145)
Sodium: 136 mmol/L (ref 136–145)

## 2013-04-24 LAB — CBC WITH DIFFERENTIAL/PLATELET
Basophil #: 0 10*3/uL (ref 0.0–0.1)
Basophil %: 0.4 %
EOS ABS: 0 10*3/uL (ref 0.0–0.7)
Eosinophil %: 0.4 %
HCT: 32 % — ABNORMAL LOW (ref 35.0–47.0)
HGB: 9.7 g/dL — ABNORMAL LOW (ref 12.0–16.0)
Lymphocyte #: 0.8 10*3/uL — ABNORMAL LOW (ref 1.0–3.6)
Lymphocyte %: 11.3 %
MCH: 30.7 pg (ref 26.0–34.0)
MCHC: 30.4 g/dL — ABNORMAL LOW (ref 32.0–36.0)
MCV: 101 fL — ABNORMAL HIGH (ref 80–100)
MONO ABS: 0.6 x10 3/mm (ref 0.2–0.9)
Monocyte %: 8.3 %
NEUTROS ABS: 6 10*3/uL (ref 1.4–6.5)
Neutrophil %: 79.6 %
Platelet: 289 10*3/uL (ref 150–440)
RBC: 3.16 10*6/uL — AB (ref 3.80–5.20)
RDW: 14 % (ref 11.5–14.5)
WBC: 7.5 10*3/uL (ref 3.6–11.0)

## 2013-04-24 LAB — BASIC METABOLIC PANEL
ANION GAP: 1 — AB (ref 7–16)
BUN: 41 mg/dL — ABNORMAL HIGH (ref 7–18)
Calcium, Total: 9.8 mg/dL (ref 8.5–10.1)
Chloride: 101 mmol/L (ref 98–107)
Co2: 32 mmol/L (ref 21–32)
Creatinine: 1.55 mg/dL — ABNORMAL HIGH (ref 0.60–1.30)
EGFR (Non-African Amer.): 32 — ABNORMAL LOW
GFR CALC AF AMER: 37 — AB
Glucose: 254 mg/dL — ABNORMAL HIGH (ref 65–99)
Osmolality: 287 (ref 275–301)
Potassium: 5.3 mmol/L — ABNORMAL HIGH (ref 3.5–5.1)
Sodium: 134 mmol/L — ABNORMAL LOW (ref 136–145)

## 2013-04-24 LAB — PROTIME-INR
INR: 2.1
PROTHROMBIN TIME: 22.9 s — AB (ref 11.5–14.7)

## 2013-04-25 LAB — CBC WITH DIFFERENTIAL/PLATELET
BASOS PCT: 0.2 %
Basophil #: 0 10*3/uL (ref 0.0–0.1)
EOS ABS: 0 10*3/uL (ref 0.0–0.7)
Eosinophil %: 0 %
HCT: 29.1 % — AB (ref 35.0–47.0)
HGB: 9.3 g/dL — AB (ref 12.0–16.0)
LYMPHS ABS: 0.8 10*3/uL — AB (ref 1.0–3.6)
LYMPHS PCT: 12.2 %
MCH: 32.2 pg (ref 26.0–34.0)
MCHC: 31.8 g/dL — AB (ref 32.0–36.0)
MCV: 101 fL — ABNORMAL HIGH (ref 80–100)
Monocyte #: 0.8 x10 3/mm (ref 0.2–0.9)
Monocyte %: 12 %
NEUTROS PCT: 75.6 %
Neutrophil #: 5 10*3/uL (ref 1.4–6.5)
Platelet: 246 10*3/uL (ref 150–440)
RBC: 2.88 10*6/uL — AB (ref 3.80–5.20)
RDW: 13.3 % (ref 11.5–14.5)
WBC: 6.6 10*3/uL (ref 3.6–11.0)

## 2013-04-25 LAB — PROTIME-INR
INR: 2.8
Prothrombin Time: 29 secs — ABNORMAL HIGH (ref 11.5–14.7)

## 2013-04-25 LAB — BASIC METABOLIC PANEL
ANION GAP: 2 — AB (ref 7–16)
BUN: 46 mg/dL — ABNORMAL HIGH (ref 7–18)
CALCIUM: 9.4 mg/dL (ref 8.5–10.1)
Chloride: 98 mmol/L (ref 98–107)
Co2: 40 mmol/L (ref 21–32)
Creatinine: 1.77 mg/dL — ABNORMAL HIGH (ref 0.60–1.30)
EGFR (African American): 31 — ABNORMAL LOW
EGFR (Non-African Amer.): 27 — ABNORMAL LOW
Glucose: 279 mg/dL — ABNORMAL HIGH (ref 65–99)
OSMOLALITY: 301 (ref 275–301)
Potassium: 4.3 mmol/L (ref 3.5–5.1)
SODIUM: 140 mmol/L (ref 136–145)

## 2013-04-26 LAB — BASIC METABOLIC PANEL
BUN: 45 mg/dL — AB (ref 7–18)
CALCIUM: 9.5 mg/dL (ref 8.5–10.1)
CREATININE: 1.31 mg/dL — AB (ref 0.60–1.30)
Chloride: 97 mmol/L — ABNORMAL LOW (ref 98–107)
Co2: 45 mmol/L (ref 21–32)
EGFR (African American): 45 — ABNORMAL LOW
EGFR (Non-African Amer.): 39 — ABNORMAL LOW
GLUCOSE: 254 mg/dL — AB (ref 65–99)
Osmolality: 298 (ref 275–301)
POTASSIUM: 3.8 mmol/L (ref 3.5–5.1)
SODIUM: 139 mmol/L (ref 136–145)

## 2013-04-26 LAB — PROTIME-INR
INR: 2.8
PROTHROMBIN TIME: 28.7 s — AB (ref 11.5–14.7)

## 2013-04-27 LAB — HEMOGLOBIN A1C: HEMOGLOBIN A1C: 6.6 % — AB (ref 4.2–6.3)

## 2013-04-27 LAB — TSH: THYROID STIMULATING HORM: 2.08 u[IU]/mL

## 2013-04-27 LAB — PROTIME-INR
INR: 2.9
Prothrombin Time: 29.7 secs — ABNORMAL HIGH (ref 11.5–14.7)

## 2013-04-28 LAB — BASIC METABOLIC PANEL
BUN: 45 mg/dL — ABNORMAL HIGH (ref 7–18)
CALCIUM: 9.4 mg/dL (ref 8.5–10.1)
CHLORIDE: 97 mmol/L — AB (ref 98–107)
CREATININE: 1.26 mg/dL (ref 0.60–1.30)
Co2: 43 mmol/L (ref 21–32)
EGFR (African American): 47 — ABNORMAL LOW
GFR CALC NON AF AMER: 40 — AB
GLUCOSE: 275 mg/dL — AB (ref 65–99)
OSMOLALITY: 297 (ref 275–301)
Potassium: 4.7 mmol/L (ref 3.5–5.1)
Sodium: 138 mmol/L (ref 136–145)

## 2013-04-29 LAB — WBCS, STOOL

## 2013-04-29 LAB — PROTIME-INR
INR: 4.6
Prothrombin Time: 41.9 secs — ABNORMAL HIGH (ref 11.5–14.7)

## 2013-04-29 LAB — STOOL CULTURE

## 2013-04-29 LAB — HEMOGLOBIN: HGB: 9.4 g/dL — AB (ref 12.0–16.0)

## 2013-04-30 LAB — PROTIME-INR
INR: 5
PROTHROMBIN TIME: 44.5 s — AB (ref 11.5–14.7)

## 2013-05-01 LAB — PROTIME-INR
INR: 4.7 — AB
PROTHROMBIN TIME: 42.6 s — AB (ref 11.5–14.7)

## 2013-05-03 LAB — PROTIME-INR
INR: 2.5
Prothrombin Time: 26.2 secs — ABNORMAL HIGH (ref 11.5–14.7)

## 2013-05-04 LAB — HEMOGLOBIN A1C: Hemoglobin A1C: 6.7 % — ABNORMAL HIGH (ref 4.2–6.3)

## 2013-05-06 LAB — PROTIME-INR
INR: 1.5
PROTHROMBIN TIME: 18.1 s — AB (ref 11.5–14.7)

## 2013-05-09 LAB — URINALYSIS, COMPLETE
BILIRUBIN, UR: NEGATIVE
Blood: NEGATIVE
GLUCOSE, UR: NEGATIVE mg/dL (ref 0–75)
Granular Cast: 1
Hyaline Cast: 3
Ketone: NEGATIVE
LEUKOCYTE ESTERASE: NEGATIVE
Nitrite: NEGATIVE
Ph: 5 (ref 4.5–8.0)
Protein: 30
RBC,UR: <1 /HPF (ref 0–5)
SPECIFIC GRAVITY: 1.011 (ref 1.003–1.030)
WBC UR: 2 /HPF (ref 0–5)

## 2013-05-10 LAB — CBC WITH DIFFERENTIAL/PLATELET
BASOS PCT: 0.6 %
Basophil #: 0 10*3/uL (ref 0.0–0.1)
Eosinophil #: 0 10*3/uL (ref 0.0–0.7)
Eosinophil %: 0.7 %
HCT: 28.8 % — ABNORMAL LOW (ref 35.0–47.0)
HGB: 9.3 g/dL — ABNORMAL LOW (ref 12.0–16.0)
LYMPHS PCT: 9.5 %
Lymphocyte #: 0.6 10*3/uL — ABNORMAL LOW (ref 1.0–3.6)
MCH: 33 pg (ref 26.0–34.0)
MCHC: 32.3 g/dL (ref 32.0–36.0)
MCV: 102 fL — ABNORMAL HIGH (ref 80–100)
Monocyte #: 0.5 x10 3/mm (ref 0.2–0.9)
Monocyte %: 8.1 %
Neutrophil #: 5.3 10*3/uL (ref 1.4–6.5)
Neutrophil %: 81.1 %
Platelet: 214 10*3/uL (ref 150–440)
RBC: 2.82 10*6/uL — AB (ref 3.80–5.20)
RDW: 15.2 % — ABNORMAL HIGH (ref 11.5–14.5)
WBC: 6.5 10*3/uL (ref 3.6–11.0)

## 2013-05-10 LAB — COMPREHENSIVE METABOLIC PANEL
ALK PHOS: 65 U/L
ANION GAP: 8 (ref 7–16)
Albumin: 2.5 g/dL — ABNORMAL LOW (ref 3.4–5.0)
BILIRUBIN TOTAL: 0.7 mg/dL (ref 0.2–1.0)
BUN: 44 mg/dL — AB (ref 7–18)
CO2: 39 mmol/L — AB (ref 21–32)
CREATININE: 1.81 mg/dL — AB (ref 0.60–1.30)
Calcium, Total: 9.8 mg/dL (ref 8.5–10.1)
Chloride: 88 mmol/L — ABNORMAL LOW (ref 98–107)
EGFR (African American): 30 — ABNORMAL LOW
EGFR (Non-African Amer.): 26 — ABNORMAL LOW
Glucose: 92 mg/dL (ref 65–99)
Osmolality: 281 (ref 275–301)
Potassium: 4.3 mmol/L (ref 3.5–5.1)
SGOT(AST): 21 U/L (ref 15–37)
SGPT (ALT): 25 U/L (ref 12–78)
SODIUM: 135 mmol/L — AB (ref 136–145)
TOTAL PROTEIN: 7.3 g/dL (ref 6.4–8.2)

## 2013-05-10 LAB — MAGNESIUM: Magnesium: 1.6 mg/dL — ABNORMAL LOW

## 2013-05-10 LAB — TSH: THYROID STIMULATING HORM: 1.89 u[IU]/mL

## 2013-05-10 LAB — PROTIME-INR
INR: 1.5
Prothrombin Time: 18.2 secs — ABNORMAL HIGH (ref 11.5–14.7)

## 2013-05-10 LAB — URINE CULTURE

## 2013-05-11 LAB — PROTIME-INR
INR: 1.4
Prothrombin Time: 17 secs — ABNORMAL HIGH (ref 11.5–14.7)

## 2013-05-13 LAB — PROTIME-INR
INR: 2.7
Prothrombin Time: 27.6 secs — ABNORMAL HIGH (ref 11.5–14.7)

## 2013-05-14 ENCOUNTER — Ambulatory Visit: Payer: Self-pay | Admitting: Nurse Practitioner

## 2013-05-14 ENCOUNTER — Encounter: Payer: Self-pay | Admitting: Internal Medicine

## 2013-05-18 LAB — PROTIME-INR
INR: 3.2
PROTHROMBIN TIME: 32.1 s — AB (ref 11.5–14.7)

## 2013-05-20 LAB — URINALYSIS, COMPLETE
BILIRUBIN, UR: NEGATIVE
GLUCOSE, UR: NEGATIVE mg/dL (ref 0–75)
Ketone: NEGATIVE
NITRITE: NEGATIVE
Ph: 7 (ref 4.5–8.0)
Protein: 30
RBC,UR: NONE SEEN /HPF (ref 0–5)
SPECIFIC GRAVITY: 1.008 (ref 1.003–1.030)
Squamous Epithelial: 1
WBC UR: 1397 /HPF (ref 0–5)

## 2013-05-22 LAB — URINE CULTURE

## 2013-05-23 LAB — PROTIME-INR
INR: 3.5
Prothrombin Time: 33.8 secs — ABNORMAL HIGH (ref 11.5–14.7)

## 2013-05-25 LAB — PROTIME-INR
INR: 2.3
Prothrombin Time: 24.5 secs — ABNORMAL HIGH (ref 11.5–14.7)

## 2013-05-29 LAB — URINALYSIS, COMPLETE
BILIRUBIN, UR: NEGATIVE
Glucose,UR: NEGATIVE mg/dL (ref 0–75)
Ketone: NEGATIVE
Nitrite: NEGATIVE
Ph: 6 (ref 4.5–8.0)
Protein: NEGATIVE
RBC,UR: 5 /HPF (ref 0–5)
Specific Gravity: 1.006 (ref 1.003–1.030)
Squamous Epithelial: NONE SEEN

## 2013-05-31 LAB — URINE CULTURE

## 2013-06-10 LAB — URINALYSIS, COMPLETE
Bacteria: NONE SEEN
Bilirubin,UR: NEGATIVE
Blood: NEGATIVE
Glucose,UR: NEGATIVE mg/dL (ref 0–75)
KETONE: NEGATIVE
Leukocyte Esterase: NEGATIVE
Nitrite: NEGATIVE
Ph: 5 (ref 4.5–8.0)
Protein: NEGATIVE
Specific Gravity: 1.008 (ref 1.003–1.030)
Squamous Epithelial: <1
WBC UR: 3 /HPF (ref 0–5)

## 2013-06-10 LAB — PROTIME-INR
INR: 2
Prothrombin Time: 21.8 secs — ABNORMAL HIGH (ref 11.5–14.7)

## 2013-06-11 LAB — URINE CULTURE

## 2013-06-14 ENCOUNTER — Encounter: Payer: Self-pay | Admitting: Internal Medicine

## 2013-12-14 DEATH — deceased

## 2014-04-15 IMAGING — CR DG CHEST 1V
1 series · 1 of 1 positions shown · non-contrast
Comparison: 04/20/2013

CLINICAL DATA: Lethargy and hypoxia.

EXAM:
CHEST - 1 VIEW

[ap]
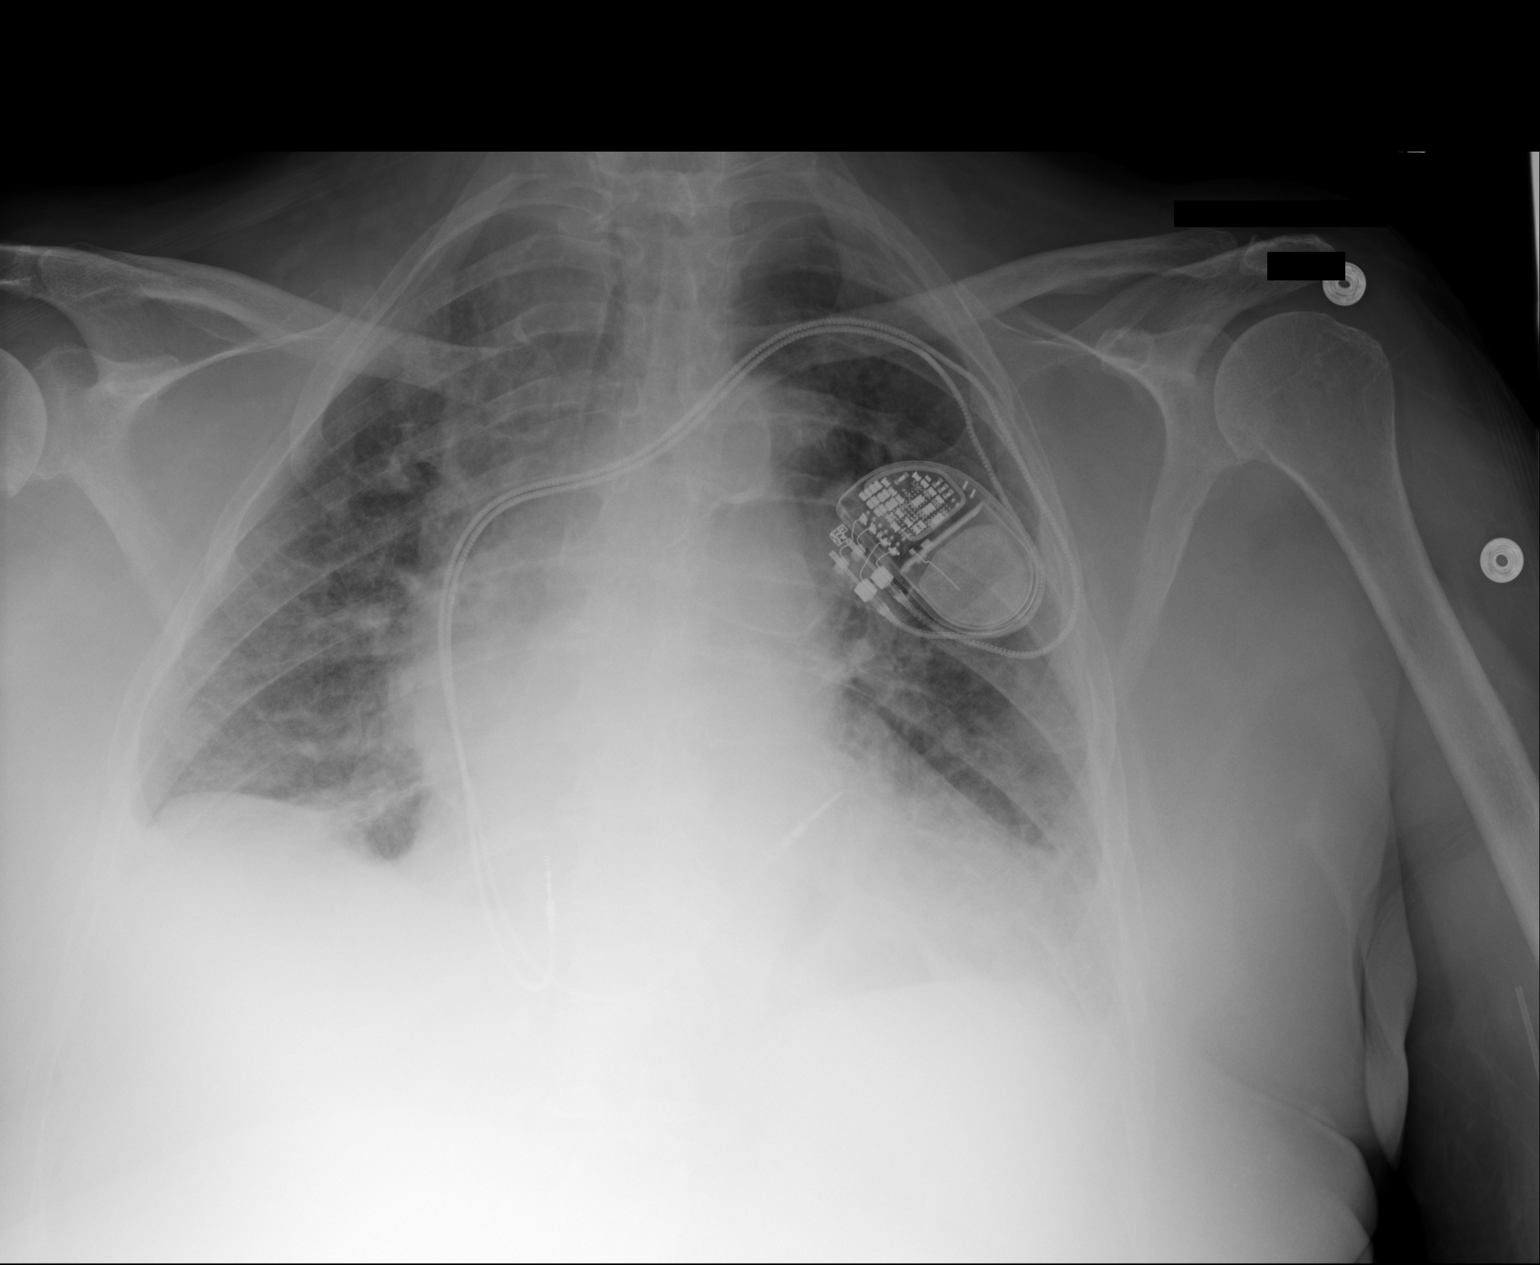

[1 of 1 positions shown; findings below may reference images not displayed]

FINDINGS: Stable positioning of cardiac pacemaker. Shallow inspiration with
elevation of right hemidiaphragm. Cardiac enlargement with
increasing pulmonary vascularity small bilateral pleural effusions.
IMPRESSION: Progression of congestive changes since previous study.

## 2014-05-06 NOTE — H&P (Signed)
PATIENT NAME:  Carrie Shea, Carrie Shea MR#:  [id number] DATE OF BIRTH:  March 01, 1931  DATE OF ADMISSION:  01/27/2012  PRIMARY CARE PHYSICIAN:  Yves Dill, MD  CHIEF COMPLAINT:  Shortness of breath, cough and congestion for about a week.   HISTORY OF PRESENT ILLNESS:  The patient is a pleasant 81 year old Caucasian female with multiple medical problems who comes in to the Emergency Room after she started having cough and congestion about a week ago, started having increasing shortness of breath at home. She does have a cough, however, she is unable to produce any phlegm. She uses oxygen 2 liters nasal cannula at nighttime only. She was seen by primary nurse practitioner/PA at Dr. Margaretann Loveless office about 3 to 4 days ago and was started on Cipro b.i.d. for a UTI. The patient has completed 4 out of 7 days of treatment. She was also prescribed some over-the-counter cough congestion medication and Symbicort.  The patient continued to deteriorate, got very fatigued and the EMTs were called. She was found to be hypoxic in the upper 80s, came to the Emergency Room and currently is 99% on 2 liters nasal cannula oxygen. She is being admitted for acute on chronic respiratory failure due to COPD flare and suspected acute bronchitis with mild CHF.   PAST MEDICAL HISTORY: 1.  History of chronic atrial fibrillation on Coumadin.  2.  Palpitations due to frequent PACs and PVCs.  3.  Mitral regurgitation, moderate per echo in the past.  4.  Essential hypertension.  5.  History of PE.  6.  Gastroesophageal reflux disease.  7.  Chronic renal failure with creatinine around 2. 8.  Type 2 diabetes.  9.  Hyperlipidemia.  10.  Chronic obstructive pulmonary disease on home oxygen at bedtime, 2 liters nasal cannula.  11.  Obesity.  13.  Chronic lower extremity edema along with chronic venous stasis changes.   PAST SURGICAL HISTORY: Hysterectomy, cholecystectomy and bilateral cataract surgery.   SOCIAL HISTORY: She lives  at home, used to smoke for many years, quit in 2000.  She is a widow.  ALLERGIES:  1.  SULFA. 2.  TAPE.  FAMILY HISTORY: Mother and father deceased of heart disease.  Heart disease present in the family.   CURRENT HOME MEDICATIONS: 1.  Vitamin D 2000 international units daily.  2.  Uloric 40 mg p.o. daily.  3.  Symbicort 80/4.5. two puffs b.i.d.  4.  Spiriva 18 mcg inhalation, 1 inhalation daily.  5.  Senna Plus 50/8.6 mg 2 tablets p.o. daily.  6.  Potassium chloride 10 mEq. p.o. daily.  7.  Pioglitazone 15 mg p.o. daily.  8.  Protonix 40 mg daily.  9.  Oxycodone 5 mg 1 tablet 3 times a day.  10.  Omega-3 fatty acid 1 tablet 3 times a day.  11.  Multivitamin p.o. daily.  12.  Glipizide 10 mg p.o. b.i.d.  13.  Lasix 20 mg p.o. daily.  14.  Fish oil 1000 mg 2 capsules orally once a day.  15.  Coumadin 3 mg 1 tablet daily.  15.  Carvedilol 12.5 mg 1 tablet b.i.d.  16.  Benicar 5 mg 2 tablets daily.  17.  Atorvastatin 10 mg p.o. daily at bedtime.  18.  Xanax 0.25 mg p.o. daily as needed.   REVIEW OF SYSTEMS:  CONSTITUTIONAL: Positive for fatigue, weakness.  EYES: No blurred or double vision. No glaucoma.  ENT: No tinnitus, ear pain, hearing loss or postnasal drip.  RESPIRATORY: Positive for cough, wheeze  and shortness of breath.  CARDIOVASCULAR: No chest pain. Positive for bilateral rib pain, shortness of breath. No chest pain.  GASTROINTESTINAL: No nausea, vomiting, diarrhea or abdominal pain. No GERD.   GENITOURINARY: No dysuria or hematuria.  ENDOCRINE: No polyuria, nocturia, thyroid problems.  HEMATOLOGY: No anemia or easy bruising.  SKIN: No acne or rash. The patient has venous stasis changes all over in both lower extremities.  MUSCULOSKELETAL: Positive for arthritis.  NEUROLOGIC: No CVA or TIA. PSYCHIATRIC: No anxiety or depression.   All other systems reviewed and negative.   LABORATORY AND DIAGNOSTIC DATA:  Chest x-ray is suggestive of bilateral diffuse  interstitial thickening, likely representing pneumonitis versus interstitial edema. CBC within normal limits. Glucose 105, BUN 38, creatinine 1.38, sodium 136, potassium 4.6, chloride 102, bicarbonate is 32. PT-INR is 21.5 and 1.8. B-type natriuretic peptide is 594.  The first set of cardiac enzymes is negative. EKG shows left axis deviation with normal sinus rhythm.   ASSESSMENT AND PLAN: A 81 year old female with multiple medical problems including chronic atrial fibrillation on Coumadin, history of pulmonary embolus on Coumadin, essential hypertension, chronic obstructive pulmonary disease who comes in with:   1.  Acute on chronic hypoxic respiratory failure due to combination of chronic obstructive pulmonary disease flare and mild congestive heart failure, suspected diastolic.  I do not have an old echocardiogram and ejection fraction not known at this time. The patient will be admitted on the telemetry floor for Dr. Ellsworth Lennoxejan-Sie. We will continue p.o. Lasix. I do not feel she is in florid heart failure requiring intravenous Lasix at this time. Will give intravenous Solu-Medrol around the clock.  Start the patient on Rocephin and Zithromax, change to p.o. antibiotics once the patient is stable. Will continue oxygen, inhalers and nebulizers as needed. Follow up blood cultures and sputum cultures if the patient is able to give sputum.  2.  Hypertension. Will continue on home medications, which is Benicar and carvedilol.  3.  Chronic atrial fibrillation and history of pulmonary embolus. As the patient is on Coumadin we will continue her home dose and monitor PT/INR in the setting of antibiotics.  4.  Type 2 diabetes. Will continue patient on glipizide and give sliding scale insulin.  5.  Hyperlipidemia on atorvastatin.  6.  Morbid obesity.  7.  Chronic venous stasis changes.  The patient does not seem to be in florid heart failure. Her leg edema appears to be stable.  8.  Deep vein thrombosis prophylaxis.  The patient already on Coumadin.   Further work-up according to the patient's clinical course.  Hospital admission plan was discussed with patient.  CODE STATUS:  The patient is a full code.  The patient's care will be assumed by Dr. Ellsworth Lennoxejan-Sie. The case was discussed with him.   TIME SPENT: 50 minutes.    ____________________________ Wylie HailSona A. Allena KatzPatel, MD sap:ct D: 01/27/2012 11:52:53 ET T: 01/27/2012 12:37:44 ET JOB#: [id number]  cc: Chan Rosasco A. Allena KatzPatel, MD, <Dictator> Margaretann LovelessNeelam S. Khan, MD Willow OraSONA A Lelah Rennaker MD ELECTRONICALLY SIGNED 01/28/2012 7:27

## 2014-05-06 NOTE — Consult Note (Signed)
Brief Consult Note: Diagnosis: SOB AFIB COPD CHF.   Patient was seen by consultant.   Consult note dictated.   Recommend further assessment or treatment.   Orders entered.   Discussed with Attending MD.   Comments: IMP CHF acute on chronic diastolic dm mitral regurg copd hypoxemia afib gerd  hx PEedema obesity . plan romi echo vq scan 02 inhalers lasix iv evaluate for cystitis continue coumadin medical therapy for now.  Electronic Signatures: Dorothyann Pengallwood, Dwayne D (MD)  (Signed 30-Jul-14 07:45)  Authored: Brief Consult Note   Last Updated: 30-Jul-14 07:45 by Alwyn Peaallwood, Dwayne D (MD)

## 2014-05-06 NOTE — Consult Note (Signed)
Chief Complaint:  Subjective/Chief Complaint After phyiscal therapy she thinks she can manage at home alone now. She wants to try and go home tomorrow.   VITAL SIGNS/ANCILLARY NOTES: **Vital Signs.:   21-Dec-14 16:16  Vital Signs Type Q 4hr  Temperature Temperature (F) 97.9  Celsius 36.6  Temperature Source oral  Pulse Pulse 60  Respirations Respirations 18  Systolic BP Systolic BP 116  Diastolic BP (mmHg) Diastolic BP (mmHg) 52  Mean BP 73  Pulse Ox % Pulse Ox % 98  Pulse Ox Activity Level  At rest  Oxygen Delivery 2L  *Intake and Output.:   Shift 21-Dec-14 15:00  Grand Totals Intake:  120 Output:  425    Net:  -305 24 Hr.:  -305  Oral Intake      In:  120  Urine ml     Out:  425  Length of Stay Totals Intake:  1843 Output:  2315    Net:  -472  Rehab Summary:   21-Dec-14 11:33  Anticipated Discharge Disposition  home with home health; HHPT; HHOT   Brief Assessment:  GEN well developed, well nourished, no acute distress   Cardiac Irregular   Respiratory normal resp effort  clear BS   Gastrointestinal Normal   Gastrointestinal details normal Soft   EXTR negative cyanosis/clubbing, negative edema   Additional Physical Exam PPM site look good/bandaged no swelling or bleeding   Lab Results: LabObservation:  19-Dec-14 16:32   OBSERVATION Reason for Test Lead placement  Routine Coag:  20-Dec-14 06:16   Prothrombin  20.6  INR 1.8 (INR reference interval applies to patients on anticoagulant therapy. A single INR therapeutic range for coumarins is not optimal for all indications; however, the suggested range for most indications is 2.0 - 3.0. Exceptions to the INR Reference Range may include: Prosthetic heart valves, acute myocardial infarction, prevention of myocardial infarction, and combinations of aspirin and anticoagulant. The need for a higher or lower target INR must be assessed individually. Reference: The Pharmacology and Management of the Vitamin K   antagonists: the seventh ACCP Conference on Antithrombotic and Thrombolytic Therapy. Chest.2004 Sept:126 (3suppl): [id number]. A HCT value >55% may artifactually increase the PT.  In one study,  the increase was an average of 25%. Reference:  "Effect on Routine and Special Coagulation Testing Values of Citrate Anticoagulant Adjustment in Patients with High HCT Values." American Journal of Clinical Pathology 2006;126:400-405.)  21-Dec-14 04:40   Prothrombin  23.6  INR 2.2 (INR reference interval applies to patients on anticoagulant therapy. A single INR therapeutic range for coumarins is not optimal for all indications; however, the suggested range for most indications is 2.0 - 3.0. Exceptions to the INR Reference Range may include: Prosthetic heart valves, acute myocardial infarction, prevention of myocardial infarction, and combinations of aspirin and anticoagulant. The need for a higher or lower target INR must be assessed individually. Reference: The Pharmacology and Management of the Vitamin K  antagonists: the seventh ACCP Conference on Antithrombotic and Thrombolytic Therapy. Chest.2004 Sept:126 (3suppl): [id number]. A HCT value >55% may artifactually increase the PT.  In one study,  the increase was an average of 25%. Reference:  "Effect on Routine and Special Coagulation Testing Values of Citrate Anticoagulant Adjustment in Patients with High HCT Values." American Journal of Clinical Pathology 2006;126:400-405.)   Assessment/Plan:  Assessment/Plan:  Assessment IMP S/P PPM AFIB SSS Brady DJD DM Anxiety HTN COPD .   Plan PLAN D/C home in the am Continue long term anticoug Pain control prn Advance  diet PT/OT Social services Bp control F/U PT INR Increase activity   Electronic Signatures: Dorothyann Peng D (MD)  (Signed 22-Dec-14 00:31)  Authored: Chief Complaint, VITAL SIGNS/ANCILLARY NOTES, Brief Assessment, Lab Results, Assessment/Plan   Last Updated:  22-Dec-14 00:31 by Alwyn Pea (MD)

## 2014-05-06 NOTE — Consult Note (Signed)
Chief Complaint:  Subjective/Chief Complaint Pt states that she is not able to care for herself at home alone since she has restricted use of her left arm. She lives alone and has arthritis.   VITAL SIGNS/ANCILLARY NOTES: **Vital Signs.:   20-Dec-14 13:34  Vital Signs Type Q 4hr  Temperature Temperature (F) 97.8  Celsius 36.5  Temperature Source oral  Pulse Pulse 83  Respirations Respirations 18  Systolic BP Systolic BP 118  Diastolic BP (mmHg) Diastolic BP (mmHg) 74  Mean BP 88  Pulse Ox % Pulse Ox % 98  Pulse Ox Activity Level  At rest  Oxygen Delivery 2L  *Intake and Output.:   20-Dec-14 13:57  Grand Totals Intake:   Output:  300    Net:  -300 24 Hr.:  -560  Urine ml     Out:  300  Urinary Method  Bedpan   Brief Assessment:  GEN well developed, well nourished, no acute distress   Cardiac Irregular   Respiratory normal resp effort  clear BS   Gastrointestinal Normal   Gastrointestinal details normal Soft   EXTR negative cyanosis/clubbing, negative edema   Additional Physical Exam PPM site look good/bandaged no swelling or bleeding   Lab Results: Routine Coag:  20-Dec-14 06:16   Prothrombin  20.6  INR 1.8 (INR reference interval applies to patients on anticoagulant therapy. A single INR therapeutic range for coumarins is not optimal for all indications; however, the suggested range for most indications is 2.0 - 3.0. Exceptions to the INR Reference Range may include: Prosthetic heart valves, acute myocardial infarction, prevention of myocardial infarction, and combinations of aspirin and anticoagulant. The need for a higher or lower target INR must be assessed individually. Reference: The Pharmacology and Management of the Vitamin K  antagonists: the seventh ACCP Conference on Antithrombotic and Thrombolytic Therapy. Chest.2004 Sept:126 (3suppl): [id number]. A HCT value >55% may artifactually increase the PT.  In one study,  the increase was an average of  25%. Reference:  "Effect on Routine and Special Coagulation Testing Values of Citrate Anticoagulant Adjustment in Patients with High HCT Values." American Journal of Clinical Pathology 2006;126:400-405.)   Assessment/Plan:  Assessment/Plan:  Assessment IMP S/P PPM AFIB SSS Brady DJD DM Anxiety HTN COPD .   Plan PLAN Continue long term anticoug Pain control prn Advance diet PT/OT Consider placement Social services Bp control F/U PT INR Increase activity   Electronic Signatures: Dorothyann Pengallwood, Trasean Delima D (MD)  (Signed 21-Dec-14 07:43)  Authored: Chief Complaint, VITAL SIGNS/ANCILLARY NOTES, Brief Assessment, Lab Results, Assessment/Plan   Last Updated: 21-Dec-14 07:43 by Alwyn Peaallwood, Reise Hietala D (MD)

## 2014-05-06 NOTE — H&P (Signed)
PATIENT NAME:  Carrie Shea, Carrie Shea MR#:  [id number] DATE OF BIRTH:  1931-08-31  DATE OF ADMISSION:  08/10/2012  PRIMARY CARE PHYSICIAN: Dr. Yves Dill.   REFERRING PHYSICIAN: Dr. Shaune Pollack.   CHIEF COMPLAINT: Shortness of breath, weight gain.   HISTORY OF PRESENT ILLNESS: The patient is a 81 year old Caucasian female with a past medical history of congestive heart failure, COPD with chronic respiratory failure (lives on 2 liters of oxygen by nasal cannula at bedtime), history of pulmonary embolism and chronic atrial fibrillation on Coumadin and other medical problems, is presenting to the ER with a chief complaint of a 3-day history of shortness of breath and worsening of bilateral lower extremity edema. The patient is also complaining that she is feeling weak and tired, and her shortness of breath is getting progressively worse. She usually lives on 2 liters of oxygen at bedtime but for the past 3 days she has been using 2 liters of oxygen continuously. She reports that she has gained 3 to 4 pounds of weight in the past few days. The patient lives alone, and as her shortness of breath is getting worse and as she is not feeling right, she took her neighbor's help and came to the ER. Here, the patient is placed on 3 liters of oxygen, and chest x-ray has revealed mild pulmonary edema. The patient was given Lasix 60 mg IV and also diagnosed with acute cystitis as her urine has revealed trace leuk esterase. The patient's initial INR is at 2.5. Hospitalist team is called to admit the patient for hypoxia, congestive heart failure exacerbation and acute cystitis.   PAST MEDICAL HISTORY: Chronic atrial fibrillation on Coumadin. INR is therapeutic at 2.5. History of palpitations due to frequent PACs and PVCs, essential hypertension, history of pulmonary embolism, GERD, chronic renal failure (baseline creatinine at around 2), mitral regurgitation moderate, type 2 diabetes mellitus, hyperlipidemia, COPD with chronic  respiratory failure (lives on 2 liters of oxygen via nasal cannula at nighttime), obesity, chronic lower extremity edema with chronic venous stasis changes.   PAST SURGICAL HISTORY: Hysterectomy, cholecystectomy and bilateral cataract surgery.   ALLERGIES: SULFA, PREDNISONE AND TAPE.   PSYCHOSOCIAL HISTORY: Lives at home, lives alone. Used to smoke but quit smoking in the year 2000. Denies alcohol or illicit drug usage.   FAMILY HISTORY: Mother and father deceased of heart disease.   HOME MEDICATIONS: Coumadin 3 mg p.o. once daily, Uloric 40 mg once daily, tramadol 50 mg once daily, potassium chloride 20 mEq once a day, pantoprazole 40 mg once daily, metoprolol 25 mg twice a day, Lipitor 10 mg once daily, glipizide 10 mg changed by her physician to once daily recently in view of hypoglycemia, furosemide 40 mg twice a day, fenofibrate 48 mg once a day, amiodarone 200 mg once daily, alprazolam 0.25 mg once a day on as needed basis for anxiety.   REVIEW OF SYSTEMS:  CONSTITUTIONAL: Denies any fever but complaining of fatigue.  EYES: Denies blurry vision, glaucoma.  EARS, NOSE, THROAT: Denies epistaxis, ear pain.  RESPIRATION: Denies cough. Has chronic history of COPD and lives on 2 liters of oxygen via nasal cannula at bedtime.  CARDIOVASCULAR: No chest pain or syncope.  GASTROINTESTINAL: Denies nausea, vomiting, diarrhea.  GENITOURINARY: No dysuria, hematuria.  GYNECOLOGIC AND BREASTS: Denies breast mass or vaginal discharge.  ENDOCRINE: Denies polyuria, nocturia, heat or cold intolerance.  HEMATOLOGIC AND LYMPHATIC: Denies anemia, easy bruising.  INTEGUMENTARY: No acne, rash, lesions.  MUSCULOSKELETAL: No joint pain in the neck. Has chronic  low back pain. Denies gout.  NEUROLOGIC: Denies any history of ataxia or dementia.  PSYCHIATRIC: No history of ADD or OCD.   PHYSICAL EXAMINATION:  VITAL SIGNS: Temperature 97.7, pulse 97, respirations 20, blood pressure 110/64, pulse ox 95% on 3  liters of oxygen.  GENERAL APPEARANCE: Not in any acute distress. Moderately built and moderately nourished.  HEENT: Normocephalic, atraumatic. Pupils are equally reacting to light and accommodation. No scleral icterus. No conjunctival injection. No sinus tenderness. No postnasal drip.  NECK: Supple. No JVD. No thyromegaly. Range of motion is intact.  LUNGS: Moderate air entry. Minimal rales and rhonchi at the bases. No accessory muscle usage. No anterior chest wall tenderness on palpation.  CARDIOVASCULAR: Irregularly irregular. Positive murmur.  GASTROINTESTINAL: Soft. Bowel sounds are positive in all 4 quadrants. Nontender, nondistended. No masses felt. No hepatosplenomegaly.  NEUROLOGIC: Awake, alert, oriented x3. Motor and sensory are grossly intact. Reflexes are 2+.  EXTREMITIES: Pitting 2+ edema is present with chronic venous changes and superficial  desquamation of the lower extremities. MUSCULOSKELETAL: No joint effusion, tenderness or erythema.  SKIN: Warm to touch. Normal turgor. No rashes.  PSYCHIATRIC: Normal mood and affect.   LABS AND IMAGING STUDIES: Urinalysis has revealed straw color. Glucose, bilirubin and ketones are negative. Leuk esterase trace. Nitrites negative. WBC: Clumps are present. PT 26.6. INR 2.5. Activated PTT 51.6. Total protein 6.8, albumin 2.8, AST 38, ALT 32. WBC 4.6, hemoglobin 12.4, hematocrit is 36.1, platelet count 97,000. Glucose 157, BNP 981, BUN 27, creatinine 1.36, serum potassium is 3.2, chloride 98. Anion gap is 2. GFR 37. Serum osmolality 284. Calcium 9.0. A 12-lead EKG: Atrial fibrillation with nonspecific ST-T wave changes.   ASSESSMENT AND PLAN: A 81 year old female coming to the Emergency Room with a 3-day history of shortness of breath, pulmonary edema and weight gain of 3 to 4 pounds and has been using 2 liters of oxygen continuously at home. Will be admitted with the following assessment and plan:  1. Acute exacerbation of congestive heart  failure: Will admit her to telemetry. The patient will be provided with Lasix 40 mg intravenous q.12 hours, and potassium supplements will be provided. Will provide her aspirin 81 mg with close monitoring of the platelet count as the patient is thrombocytopenic at this time. Will resume beta blocker, statin  which are her home medications. Cardiology consult is placed to Dr. Adrian BlackwaterShaukat Khan. A 2D echocardiogram is ordered to assess left ventricular ejection fraction.  2. Acute cystitis: Urine cultures are ordered, and the patient will be on intravenous Rocephin.  3. Chronic history of atrial fibrillation and pulmonary embolism: The patient is on Coumadin, and INR is therapeutic. V/Q scan was ordered in the Emergency Room, which is pending at this time, as the patient was hypoxemic. Will also cycle cardiac biomarkers.  4. Chronic obstructive pulmonary disease with chronic hypoxia: Currently with no exacerbation. Will continue 2 liters of oxygen and provide nebulizer treatments on as needed basis.  5. Diabetes mellitus: Will resume her glipizide but once daily as it was cut down from twice to once in view of the hypoglycemia. The patient will be on sliding scale insulin.  6. Chronic venous stasis.  7. History of chronic renal insufficiency: It seems like the patient is at her baseline. Will provide her gastrointestinal prophylaxis.  8. Deep vein thrombosis prophylaxis is not needed as the patient is on Coumadin and INR is therapeutic.   She is FULL CODE. Son is her medical power of attorney.   Transfer  to Dr. Eustaquio Boyden service in the a.m.   TOTAL TIME SPENT ON ADMISSION: 50 minutes.  Diagnosis and plan of care was discussed with her in detail. She is aware of the plan.   ____________________________ Ramonita Lab, MD ag:gb D: 08/11/2012 00:23:00 ET T: 08/11/2012 01:39:40 ET JOB#: [id number]  cc: Ramonita Lab, MD, <Dictator> Sheikh A. Ellsworth Lennox, MD Laurier Nancy, MD  Ramonita Lab MD ELECTRONICALLY  SIGNED 08/11/2012 6:02

## 2014-05-06 NOTE — Discharge Summary (Signed)
PATIENT NAME:  Carrie Shea, Carrie Shea MR#:  [id number] DATE OF BIRTH:  May 15, 1931  DATE OF ADMISSION:  08/10/2012 DATE OF DISCHARGE:  08/16/2012  ADMITTING PHYSICIAN: Dr. Ramonita LabAruna Gouru  DISCHARGING PHYSICIAN: Dr. Ellsworth Lennoxejan-Sie   DISCHARGE DIAGNOSES:   1.  Urinary tract infection with culture positive for MRSA.  2.  Acute on chronic systolic congestive heart failure.  3.  Chronic atrial fibrillation.  4.  Acute on chronic respiratory failure.  5.  Physical deconditioning.   PROCEDURES:  1.  Bilateral lower extremity Doppler.  2.  Ventilation perfusion scan.   IMAGING: Chest x-ray did show pulmonary edema, which resolved.   CONSULTATIONS: 1.  Infectious disease.  2.  Cardiology, Dr. Dorothyann Pengwayne Callwood.   HOSPITAL COURSE: The patient was admitted through the Emergency Room after presenting with symptoms congestive heart failure and clinical findings of urinary tract infection. Please see history and physical for details. The patient'Carrie Shea placed on  intravenous antibiotics with ceftriaxone for her urinary tract infection. Symptoms gradually improved. She was placed on intravenous Lasix, placed on hydralazine for congestive heart failure with symptomatic improvement. The patient'Carrie Shea urine cultures grew methicillin-resistant Staphylococcus aureus. Obtained an infectious disease consultation with Dr. Sampson GoonFitzgerald who did not find this to be a significant finding, recommended the patient will be discharged home on oral antibiotics. The patient'Carrie Shea congestive heart failure gradually became controlled with diuretics and balanced vasodilation. The patient'Carrie Shea (Dictation Anomaly) <<MISSING TEXT>>  on oxygen requirements increased and she was discharged home on oxygen. The patient also did exhibit physical deconditioning for which she received physical therapy in house. It was recommended she discharged to home with home health and physical therapy. The patient'Carrie Shea stay otherwise was fairly uncomplicated. She will be discharged  to home with home health nursing in satisfactory condition.   DISCHARGE INSTRUCTIONS:  DIET: Low sodium, low fat.   ACTIVITY: As tolerated. Continue physical therapy at home.   FOLLOW-UP: Dr. Yves DillNeelam Khan in 1 to 2 weeks and her cardiologist.   DISCHARGE MEDICATIONS: 1.  Uloric 40 mg daily.  2.  Senna  plus 2 tabs once a day.  3.  Alprazolam 0.25 mg once daily.  4.  Amiodarone 200 mg daily.  5.  Glipizide 5 mg b.i.d.  6.  Lipitor 10 mg at bedtime.  7.  Percocet 5/325 p.r.n. q. 6.  8.  Pantoprazole 40 mg q.a.m.  9.  Warfarin 3 mg daily.  10.  Furosemide 40 mg b.i.d.  11.  triamcinolone topical cream t.i.d.  12.  Actos 30 mg daily.  13.  Isosorbide 1 tab t.i.d.  14.  Hydralazine 25 mg t.i.d.  15.  Nitroglycerin 0.4 mg sublingual as needed for chest pain.   DISCHARGE PROCESS TIME SPENT: 35 minutes   ____________________________ Silas FloodSheikh A. Ellsworth Lennoxejan-Sie, MD sat:cc D: 09/01/2012 13:51:01 ET T: 09/01/2012 16:40:24 ET JOB#: [id number]  cc: Marland McalpineSheikh A. Ellsworth Lennoxejan-Sie, MD, <Dictator>

## 2014-05-06 NOTE — Discharge Summary (Signed)
PATIENT NAME:  Carrie CruzWILLIAMS, Kenzli L MR#:  [id number] DATE OF BIRTH:  Nov 09, 1931  DATE OF ADMISSION:  08/10/2012 DATE OF DISCHARGE:  08/16/2012  ADMITTING PHYSICIAN: Dr. Ramonita LabAruna Gouru  DISCHARGING PHYSICIAN: Dr. Ellsworth Lennoxejan-Sie   DISCHARGE DIAGNOSES:   1.  Urinary tract infection with culture positive for MRSA.  2.  Acute on chronic systolic congestive heart failure.  3.  Chronic atrial fibrillation.  4.  Acute on chronic respiratory failure.  5.  Physical deconditioning.   PROCEDURES:  1.  Bilateral lower extremity Doppler.  2.  Ventilation perfusion scan.   IMAGING: Chest x-ray did show pulmonary edema, which resolved.   CONSULTATIONS: 1.  Infectious disease.  2.  Cardiology, Dr. Dorothyann Pengwayne Callwood.   HOSPITAL COURSE: The patient was admitted through the Emergency Room after presenting with symptoms congestive heart failure and clinical findings of urinary tract infection. Please see history and physical for details. The patient'Krystol Rocco placed on  intravenous antibiotics with ceftriaxone for her urinary tract infection. Symptoms gradually improved. She was placed on intravenous Lasix, placed on hydralazine for congestive heart failure with symptomatic improvement. The patient'October Peery urine cultures grew methicillin-resistant Staphylococcus aureus. Obtained an infectious disease consultation with Dr. Sampson GoonFitzgerald who did not find this to be a significant finding, recommended the patient will be discharged home on oral antibiotics. The patient'Lupe Bonner congestive heart failure gradually became controlled with diuretics and balanced vasodilation. The patient'Dannel Rafter base line oxygen requirements increased and she was discharged home on oxygen. The patient also did exhibit physical deconditioning for which she received physical therapy in house. It was recommended she discharged to home with home health and physical therapy. The patient'Masai Kidd stay otherwise was fairly uncomplicated. She will be discharged to home with home health nursing  in satisfactory condition.   DISCHARGE INSTRUCTIONS:  DIET: Low sodium, low fat.   ACTIVITY: As tolerated. Continue physical therapy at home.   FOLLOW-UP: Dr. Yves DillNeelam Khan in 1 to 2 weeks and her cardiologist.   DISCHARGE MEDICATIONS: 1.  Uloric 40 mg daily.  2.  Senna  plus 2 tabs once a day.  3.  Alprazolam 0.25 mg once daily.  4.  Amiodarone 200 mg daily.  5.  Glipizide 5 mg b.i.d.  6.  Lipitor 10 mg at bedtime.  7.  Percocet 5/325 p.r.n. q. 6.  8.  Pantoprazole 40 mg q.a.m.  9.  Warfarin 3 mg daily.  10.  Furosemide 40 mg b.i.d.  11.  triamcinolone topical cream t.i.d.  12.  Actos 30 mg daily.  13.  Isosorbide 1 tab t.i.d.  14.  Hydralazine 25 mg t.i.d.  15.  Nitroglycerin 0.4 mg sublingual as needed for chest pain.   DISCHARGE PROCESS TIME SPENT: 35 minutes   ____________________________ Silas FloodSheikh A. Ellsworth Lennoxejan-Sie, MD sat:cc D: 09/01/2012 13:51:00 ET T: 09/01/2012 16:40:24 ET JOB#: [id number] Marland McalpineSHEIKH A TEJAN-SIE MD ELECTRONICALLY SIGNED 09/13/2012 11:25

## 2014-05-06 NOTE — Discharge Summary (Signed)
PATIENT NAME:  Carrie Shea, Carrie Shea MR#:  [id number] DATE OF BIRTH:  [birth year]  DATE OF ADMISSION:  01/27/2012 DATE OF DISCHARGE:  01/31/2012  DISCHARGE DIAGNOSES:  1. Pneumonia.  2. Chronic obstructive pulmonary disease exacerbation.  3. Hyperkalemia.  4. Chronic kidney disease.  5. Diastolic congestive heart failure, compensated.   DISCHARGE INSTRUCTIONS:  DIET: Low sodium, ADA diet, low cholesterol.   ACTIVITY: As tolerated.   FOLLOWUP: In 1 to 2 weeks with Dr. Yves DillNeelam Shea.   HOSPITAL COURSE: The patient was admitted through the Emergency Room complaining of shortness of breath, productive cough and chest congestion for about a week. She had been treated with antibiotics for presumed UTI and upper respiratory tract infection. However, the patient failed to respond to this treatment and was sent to Emergency Room for further evaluation. Please refer to the history and physical for full details. She was subsequently admitted in some degree of respiratory failure. Placed on high-flow oxygen, started on antibiotics, high-dose intravenous steroids and nebulized bronchodilators. The patient gradually improved over the next few days, with reduction in her FiO2 requirement to her baseline of 2 liters per minute. During hospital stay, she did not show any evidence of decompensated CHF. Her chronic kidney disease did deteriorate slightly with elevation of creatinine and hyperkalemia. Nephrology consultation was placed with Dr. Mosetta PigeonHarmeet Shea, who suspended her Benicar, recommended withholding diuretics, which resulted in normalization of her potassium and improvement in her chronic kidney disease. The patient'Carrie Shea hospital stay was otherwise uncomplicated. She is being discharged to home with home health.   CONSULTATIONS: Nephrology, Dr. Mosetta PigeonHarmeet Shea.   DISCHARGE MEDICATIONS: Please refer to medication reconciliation for full details. New medications added were Januvia 50 mg once a day, prednisone taper  60 mg, reduce by 10 mg daily, levofloxacin 500 mg daily for 5 days. The patient was instructed to stop the following medications: Potassium chloride 10 mEq daily, pioglitazone 15 mg once a day.  PROCEDURES: A 2-D echocardiogram.  IMAGING: Chest x-ray.   DISCHARGE PROCESS TIME SPENT: 37 minutes.    ____________________________ Carrie FloodSheikh A. Ellsworth Lennoxejan-Sie, Shea sat:OSi D: 01/31/2012 13:36:34 ET T: 02/01/2012 08:53:48 ET JOB#: [id number]  cc: Carrie McalpineSheikh A. Ellsworth Lennoxejan-Sie, Shea, <Dictator> Carrie Shea ELECTRONICALLY SIGNED 02/07/2012 12:26

## 2014-05-06 NOTE — Consult Note (Signed)
PATIENT NAME:  Carrie Shea, Carrie Shea MR#:  [id number] DATE OF BIRTH:  08/19/1931  DATE OF CONSULTATION:  08/14/2012  REFERRING PHYSICIAN:  Marland Mcalpine A. Ellsworth Lennox, MD CONSULTING PHYSICIAN:  Stann Mainland. Sampson Goon, MD  REASON FOR CONSULTATION: MRSA in the urine culture.   HISTORY OF PRESENT ILLNESS: This is a 81 year old female with history of CHF, COPD, pulmonary embolism, who was admitted on July 28 with shortness of breath and an acute exacerbation of CHF. She had also been having some mild urinary symptoms. The patient was admitted, seen by cardiology and has been diuresed. She was also placed on ceftriaxone for a pulmonary process. Cultures were done of her blood and urine, and her culture on her urine is growing MRSA.   The patient reports she does chronically get frequent urinary infections. Most recent one was several weeks ago, although she was not told it was MRSA and does not know the name of it. It was treated with Cipro and her minimal symptoms resolved. She currently is not having any dysuria, hematuria, fevers, chills or flank pain. She did not have any prior to admission either. She has not had a high white count or any fevers. Blood cultures have remained negative. She has not had any recent urological procedures or Foley catheters.   PAST MEDICAL HISTORY: 1.  CHF, follows with Dr. Welton Flakes.  2.  Atrial fibrillation.  3.  Hypertension.  4.  Pulmonary embolism.  5.  GERD.  6.  Chronic renal insufficiency.  7.  Type 2 diabetes.  8.  Hyperlipidemia.  9.  COPD, on home oxygen 2 liters.  10.  Obesity.  11.  Chronic venous stasis with lower extremity edema.   PAST SURGICAL HISTORY: Hysterectomy, cholecystectomy, bilateral cataracts.   ALLERGIES: THE PATIENT IS ALLERGIC TO SULFA, PREDNISONE AND TAPE.   SOCIAL HISTORY: The patient lives alone. She used to smoked, but quit in 2000. Denies alcohol or drugs. Her son is involved in her care.   FAMILY HISTORY: Her parents are both deceased of  heart disease.   REVIEW OF SYSTEMS: Eleven systems reviewed and negative except as per HPI.   MEDICATIONS: Current antibiotics include ceftriaxone 1 gram q.24. Other meds: Amiodarone, atorvastatin, hydralazine, isosorbide, metoprolol, pantoprazole, potassium, aspirin, Coumadin, glipizide, sliding scale insulin, Combivent, Lasix.   PHYSICAL EXAMINATION: VITAL SIGNS: T-max over the last 3 days is 98.2. Pulse is 58 currently, blood pressure 126/52, saturation 98% on 3 liters.  GENERAL: She is obese, sitting up in the chair. She is in no acute distress.  HEENT: Pupils equal, round, reactive to light and accommodation. Extraocular movements are intact. Sclerae anicteric. Oropharynx clear.  NECK: Supple.  HEART: Regular and somewhat brady.  LUNGS: Bibasilar crackles.  ABDOMEN: Soft, obese, nontender. No flank pain.  EXTREMITIES: 1+ pitting edema bilaterally.  NEUROLOGIC: She is alert and oriented x 3. Grossly nonfocal neuro exam.   LABORATORY DATA: White blood count on admission was 4.6, hemoglobin 12.4, platelets 97. Renal function is creatinine of 1.36 on admission, currently 1.62. Urinalysis on admission showed trace leukocyte esterase, negative nitrites, 10 white blood cells and 2 red blood cells. Urine culture grew 20,000 colony-forming units of methicillin-resistant Staph aureus that was sensitive to tetracycline, Macrobid, gentamicin, vancomycin, linezolid, tigecycline and Bactrim. It was resistant to levofloxacin,  ciprofloxacin and oxacillin. Blood cultures x 2 on the 29th are negative.   IMPRESSION: A 81 year old female with multiple medical problems whom we are consulted on for methicillin-resistant Staphylococcus aureus bacteriuria. She has only 10 white cells in  her urine and no urinary symptoms. She has had no recent urological interventions or catheters. She was admitted more for congestive heart failure.   I do not believe the methicillin-resistant Staphylococcus aureus in her urine is  clinically significant, as it has a low colony count, and her urinalysis had only 10 white cells and she had no symptoms.   RECOMMENDATIONS:  1.  At this point, I would not treat the MRSA.  2.  I have ordered another urinalysis and urine culture. If she continues to grow MRSA at a significant number, greater than 100,000 colony-forming units, or her urinalysis has greater than 10 to 20 white cells, I would suggest we place her on doxycycline 100 mg twice a day for 10 days. SHE IS ALLERGIC TO BACTRIM SO I WOULD NOT USE THAT DRUG. Macrobid would not also work given her renal insufficiency.  3.  If her urinalysis and urine did not show infection, I would not treat this.   Thank you for the consult. I will be out of town for the next week but will be glad to follow the patient as an outpatient if needed. I can also be reached via cell phone, [phone].    ____________________________ Stann Mainlandavid P. Sampson GoonFitzgerald, MD dpf:jm D: 08/14/2012 17:14:29 ET T: 08/14/2012 17:40:42 ET JOB#: [id number]  cc: Stann Mainlandavid P. Sampson GoonFitzgerald, MD, <Dictator> Carry Ortez Sampson GoonFITZGERALD MD ELECTRONICALLY SIGNED 08/15/2012 20:36

## 2014-05-07 NOTE — H&P (Signed)
PATIENT NAME:  Carrie Shea, Carrie Shea MR#:  [id number] DATE OF BIRTH:  07-Nov-1931  DATE OF ADMISSION:  04/16/2013  PRIMARY CARE PHYSICIAN: Dr. Leotis ShamesJasmine Singh.   CHIEF COMPLAINT: Nausea, vomiting, and diarrhea.   HISTORY OF PRESENT ILLNESS: This is a 81 year old female who presents to the hospital with nausea, vomiting, and diarrhea that began for the past couple of days. The patient says that she has not been feeling well for the past few days. She has been diagnosed with a urinary tract infection recently and has been treated by her primary care physician. She was on an antibiotic course and just finished it, but her symptoms were not improving so her primary care physician has switched her to a different on antibiotic. She cannot recall which antibiotic it was. She continues to have nausea, vomiting, and started having some watery diarrhea. She therefore came to the ER for further evaluation. The patient here also continued to have some nausea, vomiting. She had a CT of the abdomen and pelvis, which showed ileus versus early small bowel obstruction. She was also noted to have an abnormal urinalysis consistent with a UTI. Hospitalist services were contacted for further treatment and evaluation.   REVIEW OF SYSTEMS:    CONSTITUTIONAL: No documented fever. Positive weakness. No weight gain. No weight loss.  EYES: No blurred or double vision.  EARS, NOSE, THROAT: No tinnitus. No postnasal drip. No redness of the oropharynx.  RESPIRATORY: No cough, no wheeze, no hemoptysis, no dyspnea.  CARDIOVASCULAR: No chest pain, no orthopnea, no palpitations, no syncope.  GASTROINTESTINAL: Positive nausea. Positive vomiting. Positive diarrhea. Positive generalized abdominal pain. No melena, no hematochezia.  GENITOURINARY: No dysuria, no hematuria.  ENDOCRINE: No polyuria, nocturia, heat or cold intolerance.  HEMATOLOGIC: No anemia. No bruising. No bleeding.  INTEGUMENTARY: No rashes. No lesions.  MUSCULOSKELETAL:  No arthritis, no swelling, no gout.  NEUROLOGIC: No numbness. No tingling. No ataxia. No seizure-type activity.  PSYCHIATRIC: No anxiety, no insomnia, no ADD.   PAST MEDICAL HISTORY: Consistent with diabetes, hypertension, history of chronic atrial fibrillation, status post pacemaker, chronic kidney disease stage III, hyperlipidemia, COPD, GERD.   ALLERGIES: SULFA DRUGS, WHICH CAUSE HIVES.   SOCIAL HISTORY: Used to be a smoker, quit about 20 years ago. No alcohol abuse. No illicit drug abuse. Lives by herself.   FAMILY HISTORY: Both mother and father are deceased. Mother died from a cerebral hemorrhage. She cannot recall what her father died from.   CURRENT MEDICATIONS: As follows: Tylenol with oxycodone 5/325 one tab q.6 hours as needed, Xanax 0.25 mg daily as needed, amiodarone 200 mg daily, atorvastatin 10 mg at bedtime, Senokot with Colace 1 tab daily as needed, febuxostat 40 mg 1 tab daily, fish oil 1000 mg 2 caps b.i.d., Lasix 40 mg b.i.d., garlic supplement daily, glipizide 10 mg daily, isosorbide dinitrate 2.5 mg 1/2 tab sublingual b.i.d., potassium 20 mEq daily, Toprol 50 mg 1/2 tab b.i.d., Protonix 40 mg daily, Actos 30 mg daily, Spiriva 1 puff daily, vitamin D3 at 2000 international units daily, and warfarin 3 mg daily.   PHYSICAL EXAMINATION: Presently is as follows:  VITAL SIGNS: Temperature is 98.7, pulse 64, respirations 20, blood pressure 126/59, sats 95% on room air.  GENERAL: She is a pleasant-appearing female in no apparent distress.  HEAD, EYES, EARS, NOSE, AND THROAT: Atraumatic, normocephalic. Extraocular muscles are intact. Pupils are equal and reactive to light. Sclerae anicteric. No conjunctival injection. No pharyngeal erythema.  NECK: Supple. No new distention. No bruits, no lymphadenopathy,  no thyromegaly.  HEART: Regular. She does have a II/VI systolic ejection murmur heard at the right sternal border. No rubs, no clicks.  LUNGS: Clear to auscultation bilaterally. No  rales, no rhonchi, no wheezes. Prolonged inspiratory and expiratory phase.  ABDOMEN: Soft, distended. She has good bowel sounds. No hepatosplenomegaly appreciated.  EXTREMITIES: No evidence of any cyanosis or clubbing. She does have +1 to 2 pitting edema from the knees to the ankles bilaterally, +2 pedal and radial pulses bilaterally.  NEUROLOGIC: She is alert, awake, oriented x 3 with no focal motor or sensory deficits appreciated bilaterally.  SKIN: Moist and warm with no rashes appreciated.  LYMPHATIC: There is no cervical or axillary adenopathy.   LABORATORY AND RADIOLOGICAL DATA: Serum glucose of 86. BNP of 1000. BUN 30, creatinine 1.4, sodium 135, potassium 3.9, chloride 95, bicarbonate 34. LFTs are within normal limits. Troponin less than 0.02. White cell count 4.6, hemoglobin 11.2, hematocrit 34.2, platelet count 186. INR is 2.2. Urinalysis shows 3+ leukocyte esterase with positive nitrites and 1+ bacteria.   The patient did have a CT of the head done without contrast, which showed no acute intracranial process. She had a CT of the abdomen and pelvis done without contrast, which showed mild small bowel dilatation without discrete transition point. This favors an ileus, although partial small bowel obstruction cannot be excluded. Diverticulosis without evidence of diverticulitis. Nonobstructive right-sided nephrolithiasis.   ASSESSMENT AND PLAN: This is a 81 year old female with a history of hypertension, chronic atrial fibrillation, status post pacemaker, chronic kidney disease stage III, hyperlipidemia, chronic obstructive pulmonary disease, diabetes, gastroesophageal reflux disease, presented to the hospital due to nausea, vomiting, and also watery diarrhea. Noted to have a urinary tract infection and also ileus versus small bowel obstruction.  1.  Ileus versus early small bowel obstruction: This was noted on a CT scan on admission. The patient does have a distended abdomen, but she has good  bowel sounds and is having diarrhea; therefore, likely this is a partial obstruction. For now, we will treat her with supportive care with IV fluids, antiemetics. Keep her n.p.o., except medications and ice chips. She may need an NG tube.  I will get a surgical consult to further evaluate this.  2.  Urinary tract infection: I will give her IV ceftriaxone. Follow urine culture. 3,  Chronic kidney disease, stage III: The patient's creatinine is currently at baseline. Will follow her BUN and creatinine and follow her urine output. Hold her diuretics for now given her nausea and vomiting. 4.  Chronic obstructive pulmonary disease: No evidence of chronic obstructive pulmonary disease exacerbation. Continue with her Spiriva.  5.  Hyperlipidemia: Continue atorvastatin.  6.  Diabetes: I will hold her Actos and glipizide, give her sliding scale insulin as she is going to be n.p.o. Follow blood sugars.  7.  Gastroesophageal reflux disease: I will give her IV Protonix.  8.  History of chronic atrial fibrillation, status post pacemaker: I will continue her amiodarone and Toprol. She is currently rate controlled. Continue Coumadin. INR is therapeutic.   CODE STATUS: The patient is a full code.   TIME SPENT: 50 minutes.    ____________________________ Rolly Pancake. Cherlynn Kaiser, MD vjs:jcm D: 04/16/2013 15:52:14 ET T: 04/16/2013 16:47:36 ET JOB#: [id number]  cc: Rolly Pancake. Cherlynn Kaiser, MD, <Dictator> Houston Siren MD ELECTRONICALLY SIGNED 04/25/2013 18:50

## 2014-05-07 NOTE — Op Note (Signed)
PATIENT NAME:  Carrie Shea, Carrie Shea MR#:  [id number] DATE OF BIRTH:  [birth year]  DATE OF PROCEDURE:  01/01/2013  TITLE OF PROCEDURE: Implantation of a dual-chamber pacemaker.   INDICATION: Sick sinus syndrome and bradycardia.   DETAILS OF PROCEDURE: The patient was brought to the Operating Room after informed consent was obtained previously. The patient had the appropriate resuscitative equipment attached to the patient. IVs were inserted and anesthesia was present for conscious sedation. The patient was prepped and draped in the usual sterile manner. The area of the left infraclavicular fossa was scrubbed with chlorhexidine. Using local anesthetic, 1% lidocaine, the area of the left infraclavicular fossa was anesthetized.   Using a scalpel, a 2 cm incision was made in the infraclavicular fossa. Using a combination of electrocautery and blunt dissection, a pacemaker pocket was fashioned in the usual manner. Access to central circulation was obtained using the modified Seldinger technique and two guidewires were passed to the area of the inferior vena cava. Over the first guidewire, a 7 [place] dilator was advanced and the dilator and guidewire were subsequently removed. The right ventricular lead was taken through the 7 French to the ventricular outflow tract and subsequently placed at the high interventricular septum.   Sensed R waves there were 18.1 mV with a slew rate of 4 V/sec, impedance of 853 ohms, and a threshold of 0.9 V at 0.5 ms. The lead was sewn to the fascia with an 0 silk suture and adequate redundancy was confirmed.   Attention was then turned to the second guidewire, over which a 7 French sheath was advanced, and the dilator and guidewire were subsequently removed. The right atrial lead was advanced to the area of the right atrial appendage and secured in place. Sensed P waves there were 4 mV, slew rate of 2.8 V/sec, impedance 683 ohms and a threshold of 0.8 v at 0.5 ms. Adequate  redundancy was confirmed, and the lead was then secured to the prepectoralis fascia using an 0 silk suture.   The pocket was then irrigated copious amounts of gentamicin. Adequate hemostasis was obtained. The two leads were then connected to the generator and placed in the pocket. The pocket was closed with running layers of 2-0 and 3-0 Vicryl, and subcuticular 4-0 Vicryl. Steri-Strips were applied over the incision site as well as sterile gauze and OpSite.   SUMMARY OF IMPLANTED HARDWARE: The patient received a Medtronic dual-chamber pacemaker, model Advisa DR MRI Sure Scan, serial number [id number]. Right atrial lead was a [id number], [id number]. The Medtronic RV lead was a Medtronic [id number], [id number], all implanted on January 01, 2013.   FINAL PROGRAM PARAMETERS: Mode DDD with a lower rate limit of 60, upper tracking rate 120, upper sensory rate 120, paced AV delay 180, sensed AV delay 150. Outputs 3.5 V at 0.4 ms, adaptive responsiveness was placed on.   There were no complications. Fluoroscopy time was 2 minutes and 33 seconds. Estimated blood loss was less than 10 mL.    ____________________________ Anna GenreKevin Shea. Maisie Fushomas, MD klt:cg D: 01/01/2013 17:45:27 ET T: 01/02/2013 03:54:06 ET JOB#: [id number]  cc: Caryn BeeKevin Shea. Maisie Fushomas, MD, <Dictator> Sharion SettlerKEVIN Shea THOMAS MD ELECTRONICALLY SIGNED 02/08/2013 15:49

## 2014-05-07 NOTE — Consult Note (Signed)
PATIENT NAME:  Carrie Shea, Carrie L MR#:  [id number] DATE OF BIRTH:  [birth year]  DATE OF CONSULTATION:  04/24/2013  REFERRING PHYSICIAN:      Dr. Dan HumphreysWalker.   CONSULTING PHYSICIAN:  Carrie BlinksBruce J. Kanai Hilger, MD  REASON FOR CONSULTATION: Acute diastolic dysfunction, congestive heart failure with fluid retention.   CHIEF COMPLAINT:   Patient has weakness and fatigue, short of breath with anemia, elevated creatinine and atrial fibrillation.   HISTORY OF PRESENT ILLNESS: This is a 81 year old female with multiple medical abnormalities with ileus, urinary tract infection, diabetes, and chronic kidney disease. The patient has had known atrial fibrillation with sick sinus syndrome status post pacemaker placement on amiodarone and metoprolol and warfarin for further risk reduction in stroke. She has had some anemia with hemoglobin of 9.7. INR is 2.1 with a creatinine 1.5. Recently the patient is more short of breath, weak and fatigued with some hypoxia with a chest x-ray consistent with mild diastolic dysfunction congestive heart failure. The patient has had oxygenation and BiPAP for her treatment and has had some improvements of symptoms. The patient currently has had some improvements of shortness of her breath, but no evidence of significant improvements of lower extremity edema. The patient has had Lasix in the past, although currently concern for the possibility Lasix use with some chronic kidney disease and anemia. The patient, therefore, may benefit from oxygenation as well.   REVIEW OF SYSTEMS: Unable to evaluate review of systems due to patient's inability for conversation at this time.   PAST MEDICAL HISTORY:  1.  Diabetes.  2.  Atrial fibrillation.  3.  Chronic kidney disease.  4.  Recurrent ileus.   FAMILY HISTORY: No family members with early onset of cardiovascular disease or hypertension.   SOCIAL HISTORY: She currently denies alcohol or tobacco use.   ALLERGIES: As listed.   MEDICATIONS: As  listed.   PHYSICAL EXAMINATION:  VITAL SIGNS: Blood pressure is 100/68 bilaterally, heart rate is 70 upright, reclining, and regular.  GENERAL: She is a well-appearing female in no acute distress.  HEENT: No icterus, thyromegaly, ulcers, hemorrhage, or xanthelasma.  CARDIOVASCULAR: Regular rate and rhythm. Normal S1 and S2 without evidence murmur, gallop, or rub. PMI is diffuse. Carotid upstroke normal without bruit. Jugular venous pressure is normal.  LUNGS: Have expiratory wheezes and decreased breath in the bases.  ABDOMEN: Soft and nontender with decreased bowel movements.  EXTREMITIES:  2+ radial, trace dorsal pedal, femoral pulses with 1+ lower extremity edema. No signs of clubbing or ulcers.  NEUROLOGIC: The patient is not oriented to time, place or person.   ASSESSMENT: Elderly female with acute diastolic dysfunction congestive heart failure, likely secondary to chronic kidney disease, anemia and current illness and needing further treatment.   RECOMMENDATIONS:  1.  Continue treatment of primary infection and process, which is likely exacerbating her concerns.  2.  Consider Lasix intravenously at one dose for possible improvement of pulmonary edema, although watching closely for concerns of chronic kidney disease.  3.  Consider echocardiogram for LV systolic dysfunction or changes consistent with of above and need for adjustments in medications including beta-blocker.  4.  Oxygenation with BiPAP machine and O2 by nasal cannula.  5.  Further diagnostic testing and treatment options after above.    ____________________________ Carrie BlinksBruce J. Kassy Mcenroe, MD bjk:tc D: 04/24/2013 11:12:49 ET T: 04/24/2013 11:39:32 ET JOB#: [id number]  cc: Carrie BlinksBruce J. Kathi Dohn, MD, <Dictator> Carrie BlinksBRUCE J Duffy Dantonio MD ELECTRONICALLY SIGNED 04/27/2013 7:50

## 2014-05-07 NOTE — Consult Note (Signed)
Brief Consult Note: Diagnosis: ileus, persistent diarrhea.   Patient was seen by consultant.   Comments: Carrie Shea is a pleasant morbidly obese 81 y/o caucasian female with hx DM, ecoli UTI, COPD, CKD stage III, & chronic Atrial fibrillation on coumadin with pacemaker.  She has had persistent diarrhea & ileus.  C diff was negative.  Differentials include autonomic diabetic diarrhea, small bowel bacterial overgrowth, or microscopic colitis.  cannot r/o malignancy or polyp either.  She is overdue for colonoscopy.  We would consider flexible sigmoidoscopy with biopsies if cardiology deems her coumadin can be held to facilitate this.   Plan: 1) Continue supportive measures 2) TSH, Hgb A1c 3) tight glycemic control 4) follow up all stool studies 5) consider flexible sigmoidoscopy with biopsies if diarrhea constinues if coumadin can be held Thanks for allowing us to participate in her care.  Please see full dictated note. #[id number].  Electronic Signatures: Joselyn ArrowJones, Teaira Croft L (NP)  (Signed 14-Apr-15 00:11)  Authored: Brief Consult Note   Last Updated: 14-Apr-15 00:11 by Joselyn ArrowJones, Nancie Bocanegra L (NP)

## 2014-05-07 NOTE — Consult Note (Signed)
Chief Complaint:  Subjective/Chief Complaint Pt denies abdominal pain, nausea, vomiting or diarrhea.  Tolerating pureed diet well.   VITAL SIGNS/ANCILLARY NOTES: **Vital Signs.:   15-Apr-15 06:45  Vital Signs Type Routine  Temperature Temperature (F) 97.2  Celsius 36.2  Temperature Source oral  Pulse Pulse 61  Respirations Respirations 18  Systolic BP Systolic BP 594  Diastolic BP (mmHg) Diastolic BP (mmHg) 64  Mean BP 91  Pulse Ox % Pulse Ox % 92  Pulse Ox Activity Level  At rest  Oxygen Delivery 3L   Brief Assessment:  GEN no acute distress, obese, A/Ox3.  friend at bedside.   Cardiac Irregular   Respiratory normal resp effort   Gastrointestinal details normal Soft  Nontender  Bowel sounds normal  No rebound tenderness  No gaurding  No rigidity  Mildly distended   EXTR Trace pretibial/ankle edema bilat   Additional Physical Exam Skin: warm, dry, pink HEENT: Sclera clear, no icterus   Lab Results: Routine Chem:  15-Apr-15 04:20   Glucose, Serum  275  BUN  45  Creatinine (comp) 1.26  Sodium, Serum 138  Potassium, Serum 4.7  Chloride, Serum  97  CO2, Serum  43  Calcium (Total), Serum 9.4  Anion Gap SEE COMMENT  Osmolality (calc) 297  eGFR (African American)  47  eGFR (Non-African American)  40 (eGFR values <39m/min/1.73 m2 may be an indication of chronic kidney disease (CKD). Calculated eGFR is useful in patients with stable renal function. The eGFR calculation will not be reliable in acutely ill patients when serum creatinine is changing rapidly. It is not useful in  patients on dialysis. The eGFR calculation may not be applicable to patients at the low and high extremes of body sizes, pregnant women, and vegetarians.)  Result Comment CO2 - RESULTS VERIFIED BY REPEAT TESTING.  - CRITICAL VALUE PREVIOUSLY NOTIFIED.  - PREV. C/ 04-25-13 '@0524'  BY [facility]. AJO ANION GAP - UNABLE TO CALCULATE ANION GAP DUE TO   - ABNORMALLY HIGH CO2.  Result(s) reported on 28 Apr 2013 at 04:53AM.   Assessment/Plan:  Assessment/Plan:  Assessment Persistent diarrhea: Resolved.  No further inpatient work-up unless symptoms return.  Suspect autonomic diabetic diarrhea vs. small bowel bacterial overgrowth, microscopic colitis, cancer or polyp.  Will arrange outpatient colonoscopy. Ileus:  Clinically Improving.  I have discussed her care with Dr WAllen Norris   Plan We will arrange outpatient complete colonoscopy with Dr WAllen Norris  We will need to coordinate with cardiology regarding coumadin.   Will sign off, please call if any further inpatient GI needs or you have any questions or concerns. Thanks for allowing [place] participate in her care.   Electronic Signatures: JAndria Meuse(NP)  (Signed 15-Apr-15 13:35)  Authored: Chief Complaint, VITAL SIGNS/ANCILLARY NOTES, Brief Assessment, Lab Results, Assessment/Plan   Last Updated: 15-Apr-15 13:35 by JAndria Meuse(NP)

## 2014-05-07 NOTE — Consult Note (Signed)
Chief Complaint:  Subjective/Chief Complaint Pt denies abdominal pain, nausea, vomiting or diarrhea.   VITAL SIGNS/ANCILLARY NOTES: **Vital Signs.:   14-Apr-15 05:37  Vital Signs Type Routine  Temperature Temperature (F) 98.4  Celsius 36.8  Temperature Source oral  Respirations Respirations 18  Systolic BP Systolic BP 131  Diastolic BP (mmHg) Diastolic BP (mmHg) 80  Mean BP 97  Pulse Ox % Pulse Ox % 95  Pulse Ox Activity Level  At rest  Oxygen Delivery 3L   Brief Assessment:  GEN no acute distress, obese, A/Ox3.   Cardiac Irregular   Gastrointestinal details normal Soft  Nontender  Bowel sounds normal  No rebound tenderness  No gaurding  No rigidity  Mildly distended   EXTR Trace pretibial/ankle edema bilat   Additional Physical Exam Skin: warm, dry, pink   Lab Results: Thyroid:  14-Apr-15 04:38   Thyroid Stimulating Hormone 2.08 (0.45-4.50 (International Unit)  ----------------------- Pregnant patients have  different reference  ranges for TSH:  - - - - - - - - - -  Pregnant, first trimetser:  0.36 - 2.50 uIU/mL)  Routine Chem:  14-Apr-15 04:38   Hemoglobin A1c ([facility])  6.6 (The American Diabetes Association recommends that a primary goal of therapy should be <7% and that physicians should reevaluate the treatment regimen in patients with HbA1c values consistently >8%.)  Routine Coag:  14-Apr-15 04:38   Prothrombin  29.7  INR 2.9 (INR reference interval applies to patients on anticoagulant therapy. A single INR therapeutic range for coumarins is not optimal for all indications; however, the suggested range for most indications is 2.0 - 3.0. Exceptions to the INR Reference Range may include: Prosthetic heart valves, acute myocardial infarction, prevention of myocardial infarction, and combinations of aspirin and anticoagulant. The need for a higher or lower target INR must be assessed individually. Reference: The Pharmacology and Management of the Vitamin K   antagonists: the seventh ACCP Conference on Antithrombotic and Thrombolytic Therapy. Chest.2004 Sept:126 (3suppl): [id number]. A HCT value >55% may artifactually increase the PT.  In one study,  the increase was an average of 25%. Reference:  "Effect on Routine and Special Coagulation Testing Values of Citrate Anticoagulant Adjustment in Patients with High HCT Values." American Journal of Clinical Pathology 2006;126:400-405.)   Assessment/Plan:  Assessment/Plan:  Assessment Persistent diarrhea: Pt states no diarrhea in 24 hrs.  C diff negative.  Autonomic diabetic diarrhea vs. small bowel bacterial overgrowth, microscopic colitis, cancer or polyp.  Hgb A1c 6.6. Ileus:  Clinically Improving.  TSH normal.   Plan 1) Once ileus resolves, outpatient complete colonoscopy with Dr Servando SnareWohl 2) If diarrhea returns, we could consider inpatient unprepped flexible signmoidoscopy with biopsies if cardiology agrees to hold coumadin 3) Continue supportive measures.  4) follow up on all pending stool studies.  Please call if you have any questions or concerns   Electronic Signatures: Joselyn ArrowJones, Narada Uzzle L (NP)  (Signed 14-Apr-15 09:51)  Authored: Chief Complaint, VITAL SIGNS/ANCILLARY NOTES, Brief Assessment, Lab Results, Assessment/Plan   Last Updated: 14-Apr-15 09:51 by Joselyn ArrowJones, Kassidee Narciso L (NP)

## 2014-05-07 NOTE — Consult Note (Signed)
PATIENT NAME:  Carrie Shea, [name] MR#:  [id number] DATE OF BIRTH:  [birth year]  DATE OF CONSULTATION:  04/27/2013  REQUESTING PHYSICIAN:  Leotis ShamesJasmine Singh, MD  CONSULTING PHYSICIAN:  Joselyn ArrowKandice L. Rebecca Motta, NP  PRIMARY CARE PHYSICIAN: Leotis ShamesJasmine Singh, MD   REASON FOR CONSULTATION: Nausea, vomiting, and diarrhea.   CONSULTING GASTROENTEROLOGIST: Midge Miniumarren Wohl, MD  HISTORY OF PRESENT ILLNESS: Carrie Shea is a pleasant 81 year old Caucasian female who has history of diabetes mellitus, chronic kidney disease, atrial fibrillation, pacemaker, hyperlipidemia, COPD, GERD, anxiety, and hypertension who presents with persistent diarrhea. She tells me she had a fall about 17 days ago and was diagnosed with a urinary tract infection. She was given 7 days of antibiotics outpatient.  Around that time, she began to develop nausea, vomiting, and diarrhea. She was having anywhere between 3 and 4 loose mucousy stools per day. The stools did become more greenish and more frequent. She has since developed incontinence. She had a Clostridium difficile PCR which was negative since admission. She was diagnosed Escherichia coli UTI and has been placed on antibiotics while hospitalized. She believes her last colonoscopy was by Dr. Mechele CollinElliott 10 years ago and believes this was negative. She thinks she may have had an EGD at the same time. She has no history of IBS or IBD. Her weight has remained stable. She denies any new medications. She was recently treated for pneumonia, as was her son who was hospitalized. She denies any fever. She has had chills. She does have history of constipation and had been taking Nupercainal daily as needed for this. She had blood cultures which showed no growth. She denies any emesis since she has been admitted to the hospital. She has noticed increase flatus. She had a CT scan of the abdomen and pelvis which showed ileus versus early small bowel obstruction.  She was also noted to have an abnormal urinalysis  consistent with Escherichia coli UTI.   She has an INR of 2.8 and is on Coumadin for chronic atrial fibrillation and is status post pacemaker. Her hemoglobin is 11.1 on admission, down to 9.3.   PAST MEDICAL AND SURGICAL HISTORY: Anxiety, pulmonary edema, incisional hernia right upper quadrant, status post cholecystectomy, mild LV dysfunction with a 40% ejection fraction, pacemaker, atrial fibrillation, hypertension, diabetes mellitus, chronic kidney disease stage III, hyperlipidemia, GERD, and COPD. MEDICATIONS PRIOR TO ADMISSION: Acetaminophen/oxycodone 325/5 mg q. 6 hours p.r.n., alprazolam 0.25 mg p.r.n. anxiety daily, amiodarone 200 mg daily, atorvastatin 10 mg at bedtime, docusate/senna 50 mg/8.6 mg p.r.n., febuxostat 40 mg daily, Fish oil 2 grams b.i.d., furosemide 40 mg b.i.d., garlic daily, glipizide 10 mg daily, isosorbide 2.5 mg 1/2 tablet b.i.d., Klor-Con 20 mEq daily, metoprolol succinate 50 mg 1/2 tablet b.i.d., pantoprazole 40 mg q.a.m., pioglitazone 30 mg daily, Spiriva 18 mcg daily, vitamin D3 at 2000 international units daily, Coumadin 3 mg daily.   ALLERGIES:  PREDNISONE CAUSES SWELLING, SULFA CAUSES SWELLING, TAPE CAUSES RASH.   FAMILY HISTORY: Brother had colon cancer in his 1160s. Mother had a cerebral hemorrhage.   SOCIAL HISTORY: She quit smoking about 20 years ago. She is a widow. She has 2 healthy children. She lives alone. She denies any alcohol or illicit drug use.  REVIEW OF SYSTEMS:  GENERAL: See history of present illness.  ENDOCRINE: Her blood sugars have been poorly controlled, although she does report better control since she has been hospitalized.  CONSTITUTIONAL: She has had weakness and fatigue. NEUROLOGIC: She has had tremors.   Otherwise, negative12-point review of systems.  PHYSICAL EXAMINATION:  VITAL SIGNS: Temperature 97.4, pulse 63, respirations 18, blood pressure 139/68, oxygen saturation 95% on 3 liters O2 via nasal cannula.  GENERAL: She is a  morbidly obese Caucasian female who is alert, oriented, pleasant, cooperative, in no acute distress.  HEENT: Sclerae clear, nonicteric. Conjunctivae pink. Oropharynx pink and moist without lesions.  NECK: Supple without mass or thyromegaly.  CHEST: Heart rate is irregularly regular. She does have a pacemaker, anterior chest.  LUNGS: Clear to auscultation bilaterally.   ABDOMEN: Protuberant. She does have an easily reducible nontender right upper quadrant incisional hernia at site of previous gallbladder surgery. Unable to palpate hepatosplenomegaly. There is no rebound, tenderness or guarding. Exam limited given patient's body habitus.  RECTAL: No distal impaction. No external or internal masses noted. No colonic dilation. A small amount of light brown stool was obtained from the vault which was loose.  EXTREMITIES: No cyanosis, no clubbing. She does have 1+ pretibial ankle edema bilaterally.  NEUROLOGIC: Grossly intact.  SKIN: Pink, warm, and dry without any rash or jaundice.  PSYCHIATRIC: She is alert, cooperative, talkative, normal mood and affect.   LABORATORY STUDIES: Glucose 234, BUN of 45, creatinine 1.31, chloride 97, CO2 of 45, otherwise normal metabolic 7. Magnesium 2, albumin 2.5. Otherwise, normal LFTs.   IMPRESSION: Carrie Shea is a pleasant, morbidly obese Caucasian 81 year old female with history of diabetes mellitus, Escherichia coli urinary tract infection, chronic obstructive pulmonary disease, chronic kidney disease stage III, chronic atrial fibrillation on Coumadin with a pacemaker. She has persistent diarrhea and ileus. Clostridium difficile was negative. Differentials include autonomic diabetic diarrhea, small bowel bacterial overgrowth, or microscopic colitis. Cannot rule out malignancy or polyp either. She is overdue for colonoscopy. There was no obvious transition point for ileus. This does not appear to be a mechanical small bowel obstruction.  Would consider flexible  sigmoidoscopy with biopsies for her diarrhea if cardiology deems her Coumadin can be held to facilitate this.   PLAN:  1. Continue supportive measures.  2. TSH and hemoglobin A1c.  3. Tight glycemic control.  4. Follow up on all pending stool studies.  5. Consider flexible sigmoidoscopy with biopsies if diarrhea continues if Coumadin can be held by cardiology.  Thanks for allowing [place] to participate in her care.    ____________________________ Joselyn Arrow, NP klj:dd D: 04/27/2013 00:11:00 ET T: 04/27/2013 00:47:21 ET JOB#: [id number]  cc: Leotis Shames, MD Joselyn Arrow, NP, <Dictator>    Joselyn Arrow FNP ELECTRONICALLY SIGNED 05/04/2013 16:38

## 2014-05-07 NOTE — Discharge Summary (Signed)
Dates of Admission and Diagnosis:  Date of Admission 16-Apr-2013   Date of Discharge 29-Apr-2013   Admitting Diagnosis Diarrhea, Ileus/partial SBO, UTI   Final Diagnosis UTI, diarrhea, partial SBO, COPD exacerbation, Atrial fibrillation, s/p pacemaker, CHF, DM   Discharge Diagnosis 1 UTI   2 Diarrhea   3 partial SBO   4 COPD exacerbation   5 Acute diastolic CHF   6 Chronic atrial fibrillation on Coumadin   7 Diabetes    Chief Complaint/History of Present Illness 81 year old lady presented to the ED with c/o nausea, vomiting and diarrhea. Also c/o SOB. ED work up revealed UTI. CT abdomen was remarkable for ileus/partial small bowel obstruction.   Allergies:  Sulfa drugs: Swelling  Tape: Rash  Prednisone: Swelling    Routine Micro:  03-Apr-15 09:21   Micro Text Report URINE CULTURE   ORGANISM 1                >100,000 CFU/ML ESCHERICHIA COLI   ANTIBIOTIC                    ORG#1     AMPICILLIN                    R         CEFAZOLIN                     I         CEFOXITIN                     I         CEFTRIAXONE                   S         CIPROFLOXACIN                 R         ERTAPENEM                     S         GENTAMICIN                    S         IMIPENEM                      S         LEVOFLOXACIN                  R         NITROFURANTOIN                S         TRIMETH/SULFA                 S  Culture Comment ID TO FOLLOW SENSITIVITIES TO FOLLOW  Result(s) reported on 17 Apr 2013 at 12:35PM.  Organism Name ESCHERICHIA COLI  Organism Quantity >100,000 CFU/ML  Nitrofurantoin Sensitivity S  Cefazolin Sensitivity I  Ampicillin Sensitivity R  Ceftriaxone Sensitivity S  Ciprofloxacin Sensitivity R  Gentamicin Sensitivity S  Imipenem Sensitivity S  Levofloxacin Sensitivity R  Trimethoprim/Sulfamethoxazole Sensitivty S  Ertapenem Sensitivity S  Cefoxitin Sensitivity I  Specimen Source IN AND OUT CATH  Organism 1 >100,000 CFU/ML ESCHERICHIA COLI   Result(s) reported on 19 Apr 2013 at 09:07AM.    11:04  Micro Text Report BLOOD CULTURE   COMMENT                   NO GROWTH AEROBICALLY/ANAEROBICALLY IN 5 DAYS   ANTIBIOTIC                       Micro Text Report BLOOD CULTURE   COMMENT                   NO GROWTH AEROBICALLY/ANAEROBICALLY IN 5 DAYS   ANTIBIOTIC                       Culture Comment NO GROWTH AEROBICALLY/ANAEROBICALLY IN 5 DAYS  Result(s) reported on 21 Apr 2013 at 11:00AM.  Culture Comment NO GROWTH AEROBICALLY/ANAEROBICALLY IN 5 DAYS  Result(s) reported on 21 Apr 2013 at 11:00AM.  05-Apr-15 08:58   Micro Text Report CLOSTRIDIUM DIFFICILE   C.DIFFICILE ANTIGEN       C.DIFFICILE GDH ANTIGEN : NEGATIVE   C.DIFFICILE TOXIN A/B     C.DIFFICILE TOXINS A AND B : NEGATIVE   INTERPRETATION            Negative for C. difficile.    ANTIBIOTIC                        14-Apr-15 13:16   Micro Text Report STOOL COMPREHENSIVE   COMMENT                   NO SALMONELLA OR SHIGELLA ISOLATED   COMMENT                   NO PATHOGENIC E.COLI DETECTED   COMMENT                   NO CAMPYLOBACTER ANTIGEN DETECTED   ANTIBIOTIC                        Culture Comment NO SALMONELLA OR SHIGELLA ISOLATED  Culture Comment . NO PATHOGENIC E.COLI DETECTED  Culture Comment    . NO CAMPYLOBACTER ANTIGEN DETECTED  Result(s) reported on 28 Apr 2013 at 04:37PM.  Cardiology:  12-Apr-15 06:55   Echo Doppler REASON FOR EXAM:     COMMENTS:     PROCEDURE: [facility] - ECHO DOPPLER COMPLETE(TRANSTHOR)  - Apr 25 2013  6:55AM   RESULT: Echocardiogram Report  Patient Name:   Carrie Shea Date of Exam: 04/25/2013 Medical Rec #:  [id number]             Custom1: Date of Birth:  Jul 27, 1931          Height:       65.0 in Patient Age:    81 years           Weight:       240.0 lb Patient Gender: F                  BSA:          2.14 m??  Indications: CHF Sonographer:    Wonda Cerise RDCS Referring Phys:Shanvi Moyd  Summary:  1. Left  ventricular ejection fraction, by visual estimation, is 50 to  55%.  2. Normal global left ventricular systolic function.  3. Akinetic right ventricular free wall and apex.  4. Moderately enlarged right ventricle.  5. Moderately dilated left atrium.  6. Moderately dilated right atrium.  7. Moderate mitral  valve regurgitation.  8. Moderate tricuspid regurgitation.  9. Moderately elevated pulmonary artery systolic pressure. 10. Mildly increased left ventricular posterior wall thickness. 2D AND M-MODE MEASUREMENTS (normal ranges within parentheses): Left Ventricle:          Normal IVSd (2D):      1.04 cm (0.7-1.1) LVPWd (2D):     1.12 cm (0.7-1.1) Aorta/LA:                  Normal LVIDd (2D):  4.42 cm (3.4-5.7) Aortic Root (2D): 3.00 cm (2.4-3.7) LVIDs (2D):     3.06 cm           Left Atrium (2D): 4.00 cm (1.9-4.0) LV FS (2D):     30.8 %   (>25%) LV EF (2D):     58.6 %   (>50%)                                   Right Ventricle:                              RVd (2D): LV DIASTOLIC FUNCTION: MV Peak E: 1.61 m/s Decel Time: 301 msec MV Peak A: 1.46 m/s E/A Ratio: 0.84 SPECTRAL DOPPLER ANALYSIS (where applicable): Mitral Valve: MV P1/2 Time: 87.29 msec MV Area, PHT: 2.52cm?? Aortic Valve: AoV Max Vel: 2.08 m/s AoV Peak PG: 17.4 mmHg AoV Mean PG: LVOT Vmax: 1.27 m/s LVOT VTI:  LVOT Diameter: 1.80 cm AoV Area, Vmax: 1.55 cm?? AoV Area, VTI:  AoV Area, Vmn: Tricuspid Valve and PA/RV Systolic Pressure: TR Max Velocity: 3.61 m/s RA  Pressure: 10 mmHg RVSP/PASP: 62.1 mmHg  PHYSICIAN INTERPRETATION: Left Ventricle: The left ventricular internal cavity size was normal. LV  posterior wall thickness was mildly increased. Global LV systolic  function was normal. Left ventricular ejection fraction, by visual  estimation, is 50 to 55%. Right Ventricle: The right ventricular size is moderately enlarged. The  motion of the right ventricle free wall and apex is akinetic. Left Atrium: The left  atrium is moderately dilated. Right Atrium: The right atrium is moderately dilated. Pericardium: There is no evidence of pericardial effusion. Mitral Valve: Moderate mitral valve regurgitation is seen. Tricuspid Valve: Moderate tricuspid regurgitation is visualized.The  tricuspid regurgitant velocity is 3.61 m/s, and with an assumed right  atrial pressure of 10 mmHg, the estimated right ventricular systolic  pressure is moderately elevated at 62.1 mmHg. Aortic Valve: The aortic valve is trileaflet and structurally normal,  with normal leaflet excursion; without any evidence of aortic stenosis or  insufficiency. Aorta: The aortic root and ascending aorta are structurally normal, with  no evidence of dilitation.  [id number] Arnoldo Hooker MD Electronically signed by [id number] Arnoldo Hooker MD Signature Date/Time: 04/25/2013/2:33:57 PM  *** Final ***  IMPRESSION: .    Verified By: Lamar Blinks  ([facility]), M.D., MD  Routine Chem:  03-Apr-15 08:15   Creatinine (comp)  1.43  04-Apr-15 04:38   Creatinine (comp)  1.51  05-Apr-15 04:49   Creatinine (comp)  1.49  06-Apr-15 04:40   Creatinine (comp)  1.45  07-Apr-15 04:23   Creatinine (comp)  1.43  09-Apr-15 04:25   Creatinine (comp)  1.42  10-Apr-15 04:32   Creatinine (comp) 1.25    09:22   Creatinine (comp) 1.29    14:22   Creatinine (comp)  1.53  11-Apr-15 04:31   Creatinine (  comp)  1.55  12-Apr-15 04:52   Creatinine (comp)  1.77  13-Apr-15 04:49   Creatinine (comp)  1.31  14-Apr-15 04:38   Hemoglobin A1c ([facility])  6.6 (The American Diabetes Association recommends that a primary goal of therapy should be <7% and that physicians should reevaluate the treatment regimen in patients with HbA1c values consistently >8%.)  15-Apr-15 04:20   Creatinine (comp) 1.26  Routine UA:  03-Apr-15 09:21   Color (UA) Yellow  Clarity (UA) Cloudy  Glucose (UA) Negative  Bilirubin (UA) Negative  Ketones (UA) Negative  Specific Gravity (UA)  1.010  Blood (UA) Negative  pH (UA) 6.0  Protein (UA) 30 mg/dL  Nitrite (UA) Positive  Leukocyte Esterase (UA) 3+ (Result(s) reported on 16 Apr 2013 at 09:43AM.)  RBC (UA) NONE SEEN  WBC (UA) 222 /HPF  Bacteria (UA) 1+  Epithelial Cells (UA) 1 /HPF  WBC Clump (UA) PRESENT  Mucous (UA) PRESENT (Result(s) reported on 16 Apr 2013 at 09:43AM.)  Routine Coag:  03-Apr-15 08:15   Prothrombin -  INR - (INR reference interval applies to patients on anticoagulant therapy. A single INR therapeutic range for coumarins is not optimal for all indications; however, the suggested range for most indications is 2.0 - 3.0. Exceptions to the INR Reference Range may include: Prosthetic heart valves, acute myocardial infarction, prevention of myocardial infarction, and combinations of aspirin and anticoagulant. The need for a higher or lower target INR must be assessed individually. Reference: The Pharmacology and Management of the Vitamin K  antagonists: the seventh ACCP Conference on Antithrombotic and Thrombolytic Therapy. Chest.2004 Sept:126 (3suppl): [id number]. A HCT value >55% may artifactually increase the PT.  In one study,  the increase was an average of 25%. Reference:  "Effect on Routine and Special Coagulation Testing Values of Citrate Anticoagulant Adjustment in Patients with High HCT Values." American Journal of Clinical Pathology 2006;126:400-405.)    09:07   Prothrombin  23.9  INR 2.2 (INR reference interval applies to patients on anticoagulant therapy. A single INR therapeutic range for coumarins is not optimal for all indications; however, the suggested range for most indications is 2.0 - 3.0. Exceptions to the INR Reference Range may include: Prosthetic heart valves, acute myocardial infarction, prevention of myocardial infarction, and combinations of aspirin and anticoagulant. The need for a higher or lower target INR must be assessed individually. Reference: The Pharmacology  and Management of the Vitamin K  antagonists: the seventh ACCP Conference on Antithrombotic and Thrombolytic Therapy. Chest.2004 Sept:126 (3suppl): [id number]. A HCT value >55% may artifactually increase the PT.  In one study,  the increase was an average of 25%. Reference:  "Effect on Routine and Special Coagulation Testing Values of Citrate Anticoagulant Adjustment in Patients with High HCT Values." American Journal of Clinical Pathology 2006;126:400-405.)  04-Apr-15 04:38   Prothrombin  25.4  INR 2.4 (INR reference interval applies to patients on anticoagulant therapy. A single INR therapeutic range for coumarins is not optimal for all indications; however, the suggested range for most indications is 2.0 - 3.0. Exceptions to the INR Reference Range may include: Prosthetic heart valves, acute myocardial infarction, prevention of myocardial infarction, and combinations of aspirin and anticoagulant. The need for a higher or lower target INR must be assessed individually. Reference: The Pharmacology and Management of the Vitamin K  antagonists: the seventh ACCP Conference on Antithrombotic and Thrombolytic Therapy. Chest.2004 Sept:126 (3suppl): [id number]. A HCT value >55% may artifactually increase the PT.  In one study,  the increase was an average  of 25%. Reference:  "Effect on Routine and Special Coagulation Testing Values of Citrate Anticoagulant Adjustment in Patients with High HCT Values." American Journal of Clinical Pathology 2006;126:400-405.)  05-Apr-15 04:49   Prothrombin  31.2  INR 3.1 (INR reference interval applies to patients on anticoagulant therapy. A single INR therapeutic range for coumarins is not optimal for all indications; however, the suggested range for most indications is 2.0 - 3.0. Exceptions to the INR Reference Range may include: Prosthetic heart valves, acute myocardial infarction, prevention of myocardial infarction, and combinations of aspirin and  anticoagulant. The need for a higher or lower target INR must be assessed individually. Reference: The Pharmacology and Management of the Vitamin K  antagonists: the seventh ACCP Conference on Antithrombotic and Thrombolytic Therapy. Chest.2004 Sept:126 (3suppl): [id number]. A HCT value >55% may artifactually increase the PT.  In one study,  the increase was an average of 25%. Reference:  "Effect on Routine and Special Coagulation Testing Values of Citrate Anticoagulant Adjustment in Patients with High HCT Values." American Journal of Clinical Pathology 2006;126:400-405.)  06-Apr-15 04:40   Prothrombin  37.8  INR 4.0 (INR reference interval applies to patients on anticoagulant therapy. A single INR therapeutic range for coumarins is not optimal for all indications; however, the suggested range for most indications is 2.0 - 3.0. Exceptions to the INR Reference Range may include: Prosthetic heart valves, acute myocardial infarction, prevention of myocardial infarction, and combinations of aspirin and anticoagulant. The need for a higher or lower target INR must be assessed individually. Reference: The Pharmacology and Management of the Vitamin K  antagonists: the seventh ACCP Conference on Antithrombotic and Thrombolytic Therapy. Chest.2004 Sept:126 (3suppl): [id number]. A HCT value >55% may artifactually increase the PT.  In one study,  the increase was an average of 25%. Reference:  "Effect on Routine and Special Coagulation Testing Values of Citrate Anticoagulant Adjustment in Patients with High HCT Values." American Journal of Clinical Pathology 2006;126:400-405.)  07-Apr-15 04:23   Prothrombin  41.4  INR  4.5 (INR reference interval applies to patients on anticoagulant therapy. A single INR therapeutic range for coumarins is not optimal for all indications; however, the suggested range for most indications is 2.0 - 3.0. Exceptions to the INR Reference Range may include: Prosthetic  heart valves, acute myocardial infarction, prevention of myocardial infarction, and combinations of aspirin and anticoagulant. The need for a higher or lower target INR must be assessed individually. Reference: The Pharmacology and Management of the Vitamin K  antagonists: the seventh ACCP Conference on Antithrombotic and Thrombolytic Therapy. Chest.2004 Sept:126 (3suppl): [id number]. A HCT value >55% may artifactually increase the PT.  In one study,  the increase was an average of 25%. Reference:  "Effect on Routine and Special Coagulation Testing Values of Citrate Anticoagulant Adjustment in Patients with High HCT Values." American Journal of Clinical Pathology 2006;126:400-405.)  08-Apr-15 04:19   Prothrombin  38.6  INR  4.1 (INR reference interval applies to patients on anticoagulant therapy. A single INR therapeutic range for coumarins is not optimal for all indications; however, the suggested range for most indications is 2.0 - 3.0. Exceptions to the INR Reference Range may include: Prosthetic heart valves, acute myocardial infarction, prevention of myocardial infarction, and combinations of aspirin and anticoagulant. The need for a higher or lower target INR must be assessed individually. Reference: The Pharmacology and Management of the Vitamin K  antagonists: the seventh ACCP Conference on Antithrombotic and Thrombolytic Therapy. Chest.2004 Sept:126 (3suppl): [id number]. A HCT value >55% may artifactually increase  the PT.  In one study,  the increase was an average of 25%. Reference:  "Effect on Routine and Special Coagulation Testing Values of Citrate Anticoagulant Adjustment in Patients with High HCT Values." American Journal of Clinical Pathology 2006;126:400-405.)  09-Apr-15 04:25   Prothrombin  27.7  INR 2.7 (INR reference interval applies to patients on anticoagulant therapy. A single INR therapeutic range for coumarins is not optimal for all indications; however, the  suggested range for most indications is 2.0 - 3.0. Exceptions to the INR Reference Range may include: Prosthetic heart valves, acute myocardial infarction, prevention of myocardial infarction, and combinations of aspirin and anticoagulant. The need for a higher or lower target INR must be assessed individually. Reference: The Pharmacology and Management of the Vitamin K  antagonists: the seventh ACCP Conference on Antithrombotic and Thrombolytic Therapy. Chest.2004 Sept:126 (3suppl): [id number]. A HCT value >55% may artifactually increase the PT.  In one study,  the increase was an average of 25%. Reference:  "Effect on Routine and Special Coagulation Testing Values of Citrate Anticoagulant Adjustment in Patients with High HCT Values." American Journal of Clinical Pathology 2006;126:400-405.)  10-Apr-15 04:32   Prothrombin  22.4  INR 2.0 (INR reference interval applies to patients on anticoagulant therapy. A single INR therapeutic range for coumarins is not optimal for all indications; however, the suggested range for most indications is 2.0 - 3.0. Exceptions to the INR Reference Range may include: Prosthetic heart valves, acute myocardial infarction, prevention of myocardial infarction, and combinations of aspirin and anticoagulant. The need for a higher or lower target INR must be assessed individually. Reference: The Pharmacology and Management of the Vitamin K  antagonists: the seventh ACCP Conference on Antithrombotic and Thrombolytic Therapy. Chest.2004 Sept:126 (3suppl): [id number]. A HCT value >55% may artifactually increase the PT.  In one study,  the increase was an average of 25%. Reference:  "Effect on Routine and Special Coagulation Testing Values of Citrate Anticoagulant Adjustment in Patients with High HCT Values." American Journal of Clinical Pathology 2006;126:400-405.)  11-Apr-15 04:31   Prothrombin  22.9  INR 2.1 (INR reference interval applies to patients on  anticoagulant therapy. A single INR therapeutic range for coumarins is not optimal for all indications; however, the suggested range for most indications is 2.0 - 3.0. Exceptions to the INR Reference Range may include: Prosthetic heart valves, acute myocardial infarction, prevention of myocardial infarction, and combinations of aspirin and anticoagulant. The need for a higher or lower target INR must be assessed individually. Reference: The Pharmacology and Management of the Vitamin K  antagonists: the seventh ACCP Conference on Antithrombotic and Thrombolytic Therapy. Chest.2004 Sept:126 (3suppl): [id number]. A HCT value >55% may artifactually increase the PT.  In one study,  the increase was an average of 25%. Reference:  "Effect on Routine and Special Coagulation Testing Values of Citrate Anticoagulant Adjustment in Patients with High HCT Values." American Journal of Clinical Pathology 2006;126:400-405.)  12-Apr-15 04:52   Prothrombin  29.0  INR 2.8 (INR reference interval applies to patients on anticoagulant therapy. A single INR therapeutic range for coumarins is not optimal for all indications; however, the suggested range for most indications is 2.0 - 3.0. Exceptions to the INR Reference Range may include: Prosthetic heart valves, acute myocardial infarction, prevention of myocardial infarction, and combinations of aspirin and anticoagulant. The need for a higher or lower target INR must be assessed individually. Reference: The Pharmacology and Management of the Vitamin K  antagonists: the seventh ACCP Conference on Antithrombotic and Thrombolytic Therapy. Chest.2004  Sept:126 (3suppl): [id number]. A HCT value >55% may artifactually increase the PT.  In one study,  the increase was an average of 25%. Reference:  "Effect on Routine and Special Coagulation Testing Values of Citrate Anticoagulant Adjustment in Patients with High HCT Values." American Journal of Clinical Pathology  2006;126:400-405.)  13-Apr-15 04:49   Prothrombin  28.7  INR 2.8 (INR reference interval applies to patients on anticoagulant therapy. A single INR therapeutic range for coumarins is not optimal for all indications; however, the suggested range for most indications is 2.0 - 3.0. Exceptions to the INR Reference Range may include: Prosthetic heart valves, acute myocardial infarction, prevention of myocardial infarction, and combinations of aspirin and anticoagulant. The need for a higher or lower target INR must be assessed individually. Reference: The Pharmacology and Management of the Vitamin K  antagonists: the seventh ACCP Conference on Antithrombotic and Thrombolytic Therapy. Chest.2004 Sept:126 (3suppl): [id number]. A HCT value >55% may artifactually increase the PT.  In one study,  the increase was an average of 25%. Reference:  "Effect on Routine and Special Coagulation Testing Values of Citrate Anticoagulant Adjustment in Patients with High HCT Values." American Journal of Clinical Pathology 2006;126:400-405.)  14-Apr-15 04:38   Prothrombin  29.7  INR 2.9 (INR reference interval applies to patients on anticoagulant therapy. A single INR therapeutic range for coumarins is not optimal for all indications; however, the suggested range for most indications is 2.0 - 3.0. Exceptions to the INR Reference Range may include: Prosthetic heart valves, acute myocardial infarction, prevention of myocardial infarction, and combinations of aspirin and anticoagulant. The need for a higher or lower target INR must be assessed individually. Reference: The Pharmacology and Management of the Vitamin K  antagonists: the seventh ACCP Conference on Antithrombotic and Thrombolytic Therapy. Chest.2004 Sept:126 (3suppl): [id number]. A HCT value >55% may artifactually increase the PT.  In one study,  the increase was an average of 25%. Reference:  "Effect on Routine and Special Coagulation Testing  Values of Citrate Anticoagulant Adjustment in Patients with High HCT Values." American Journal of Clinical Pathology 2006;126:400-405.)   PERTINENT RADIOLOGY STUDIES: XRay:    03-Apr-15 08:58, Abdomen 3 Way Includes PA Chest  Abdomen 3 Way Includes PA Chest   REASON FOR EXAM:    vomiting, abd pain  COMMENTS:       PROCEDURE: DXR - DXR ABDOMEN 3-WAY (INCL PA CXR)  - Apr 16 2013  8:58AM     CLINICAL DATA:  Nausea, vomiting, diarrhea.  CHF and pacemaker.    EXAM:  ABDOMEN SERIES    COMPARISON:  None.    FINDINGS:  The heart is mildly enlarged. Patient has left-sided transvenous  pacemaker with leads to the right atrium and right ventricle. There  is elevation of right hemidiaphragm and possible focal eventration.  Perihilar peribronchial thickening is noted. No focal consolidations  or definite pleural effusions identified. No free intraperitoneal  air beneath the diaphragm.    Supine and erect views of the abdomen show dilated small bowel loops  with air-fluid levels on erect views. Small amount of large bowel  gas is identified within nondilated loops. Findings are consistent  with early or partial small bowel obstruction. Degenerative changes  are seen in the lower lumbar spine. Surgical clips are present in  the right upper quadrant of the abdomen.     IMPRESSION:  1. Cardiomegaly without pulmonary edema.  2. Elevation and possible focal eventration of the right  hemidiaphragm.  3. Partial or early small bowel  obstruction.  4. Postoperative changes in the abdomen.      Electronically Signed    By: Rosalie Gums M.D.    On: 04/16/2013 09:00         Verified By: Patterson Hammersmith, M.D.,    05-Apr-15 09:26, KUB - Kidney Ureter Bladder  KUB - Kidney Ureter Bladder   REASON FOR EXAM:    abd pain, diarrhea  COMMENTS:       PROCEDURE: DXR - DXR KIDNEY URETER BLADDER  - Apr 18 2013  9:26AM     CLINICAL DATA:  Abdominal pain    EXAM:  ABDOMEN - 1  VIEW    COMPARISON:  04/16/2013    FINDINGS:  Scattered large and smallbowel gas is noted. The small bowel  dilatation seen on the prior exam has improved slightly in the  interval. Mild right basilar atelectasis is seen.     IMPRESSION:  Slight improvement in small bowel dilatation. Continued followup is  recommended.      Electronically Signed    By: Alcide Clever M.D.    On: 04/18/2013 10:14         Verified By: Phillips Odor, M.D.,    11-Apr-15 04:56, Chest 1 View AP or PA  Chest 1 View AP or PA   REASON FOR EXAM:    LETHARGIC HYPOXIA  COMMENTS:       PROCEDURE: DXR - DXR CHEST 1 VIEWAP OR PA  - Apr 24 2013  4:56AM     CLINICAL DATA:  Lethargy and hypoxia.    EXAM:  CHEST - 1 VIEW    COMPARISON:  04/20/2013    FINDINGS:  Stable positioning of cardiac pacemaker. Shallow inspiration with  elevation of right hemidiaphragm. Cardiac enlargement with  increasing pulmonary vascularity small bilateral pleural effusions.     IMPRESSION:  Progression of congestive changes since previous study.      Electronically Signed    By: Burman Nieves M.D.    On: 04/24/2013 05:03         Verified By: Marlon Pel, M.D.,  LabUnknown:    03-Apr-15 08:58, Abdomen 3 Way Includes PA Chest  PACS Image   CT:    03-Apr-15 13:33, CT Abdomen and Pelvis Without Contrast  CT Abdomen and Pelvis Without Contrast   REASON FOR EXAM:    (1) abd pain; (2) abd pain  COMMENTS:       PROCEDURE: CT  - CT ABDOMEN AND PELVIS W0  - Apr 16 2013  1:33PM     CLINICAL DATA:  Abdominal pain and distention.  Nausea and vomiting.    EXAM:  CT ABDOMEN AND PELVIS WITHOUT CONTRAST    TECHNIQUE:  Multidetector CT imaging of the abdomen and pelvis was performed  following the standard protocol without intravenous contrast.    COMPARISON:  08/24/2004  FINDINGS:  Surgical clips are seen from prior cholecystectomy. Prominence of  the common bile duct is stable, without definite  intrahepatic  biliary dilatation. Noncontrast images of the liver, pancreas,  spleen, and left adrenal gland are normal in appearance. A  low-attenuation right adrenal mass is seen measuring 2 cm which is  stable and consistent with a benign adrenal adenoma. Mild renal  parenchymal scarring is seen bilaterally with a few tiny  nonobstructive right renal calculi. No evidence of ureteral calculi  or hydronephrosis.    No abdominal or pelvic lymphadenopathy identified. Prior  hysterectomy noted. Adnexal regions are unremarkable in appearance.  Diverticulosis  is seen involving the sigmoid colon, however there is  no evidence of diverticulitis. No other inflammatory process or  abnormal fluid collections are identified. Normal appendix is  visualized.    Mildly dilated small bowel loops are seen containing air-fluid  levels, however no discrete transition point is seen. This favors an  ileus, with partial small bowel obstruction considered less likely.  No bowel containing hernia is seen. A small midline epigastric  ventral hernia is seen containing only fat. Tiny hiatal hernia also  noted.     IMPRESSION:  Mild small bowel dilatation, without discrete transition point or  etiology apparent byCT. This favors an ileus although partial small  bowel obstruction cannot definitely be excluded. Continued  radiographic followup is recommended.  Small epigastric ventral hernia containing only fat, and tiny hiatal  hernia. No herniated bowel loops.    Diverticulosis. No radiographic evidence of diverticulitis.    Nonobstructive right nephrolithiasis. No evidence of ureteral  calculi or hydronephrosis.    Stable small benign right adrenal adenoma.      Electronically Signed    By: Myles Rosenthal M.D.    On: 04/16/2013 13:46     Verified By: Danae Orleans, M.D.,   Pertinent Past History:  Pertinent Past History COPD Chronic atrial fibrillation, s/p pacemaker Diabetes CKD   Hospital  Course:  Hospital Course Patient was admitted and started on i.v ceftriaxone for UTI. She continued to have loose stools. Nausea and vomiting resolved. She received i.v Protonix. Patient c/o SOB with congestion on CXR. She responded well to  Lasix. Urine c/s revealed E.coli sensitive to Ceftriaxone. h/o COPD on chronic O2 at home, supplemental oxygen was continued. During her stay she c/o wheezing and SOB with CO2 retention, requiring BiPAP over the weekend. She received i.v solumedrol, Duoneb and Pulmicort nebulizer treatments. Chronic atrial fibrillation, coumadin dose was adjusted with close PT/INR monitoring. Therapeutic INR 2.9 prior to discharge. ECHO revealed EF=50-55%, moderately dilated LA and RA, moderate MR and TR. Patient was evaluated by cardiology, pulmonary and GI. Diarrhea improved during her stay. Stool work up including C. diff was negative. She tolerated diet well. Intermittent anxiety was relieved by Xanax prn. She received physical therapy. Exam: Alert, oriented, NAD. HENT: no JVD, Chest: clear, CVS: RRR, S1S2, no tachycardia. Abdo: soft, nontender, + BS. Ext: trace bilateral ankle edema, resolving ecchymosis on left arm. Neuro: moving all ext.     Plan is to discharge to SNF. GI follow up and colonoscopy as out-pt.   Condition on Discharge Stable   DISCHARGE INSTRUCTIONS HOME MEDS:  Medication Reconciliation: Patient's Home Medications at Discharge:     Medication Instructions  amiodarone 200 mg oral tablet  1 tab(s) orally once a day   pantoprazole 40 mg oral delayed release tablet  1 tab(s) orally once a day (in the morning)   furosemide 40 mg oral tablet  1 tab(s) orally 2 times a day   klor-con 20 meq oral powder for reconstitution  1 each orally once a day   spiriva 18 mcg inhalation capsule  1 spray(s) inhaled once a day   fish oil 1000 mg oral capsule  2 cap(s) orally 2 times a day   vitamin d3 2000 intl units oral capsule  1 cap(s) orally once a day   febuxostat 40  mg oral tablet  1 tab(s) orally once a day   garlic oral capsule   orally    atorvastatin 10 mg oral tablet  1 tab(s) orally once a  day (at bedtime)   glipizide 10 mg oral tablet  1 tab(s) orally once a day   isosorbide dinitrate 2.5 mg sublingual tablet  0.5 tab(s) sublingual 2 times a day   metoprolol succinate 50 mg oral tablet, extended release  0.5 tab(s) orally 2 times a day   warfarin 1 mg oral tablet  2.5 tab(s) orally once a day   alprazolam 0.25 mg oral tablet  1 tab(s) orally 2 times a day, As needed, anxiety   hydrocortisone  1 application rectal 4 times a day   acetaminophen 325 mg oral tablet  2 tab(s) orally every 4 hours, As needed, pain or temp. greater than 100.4   insulin aspart 100 units/ml subcutaneous solution   subcutaneous    albuterol-ipratropium 2.5 mg-0.5 mg/3 ml inhalation solution   inhaled    lactobacillus acidophilus  2 cap(s) orally 3 times a day   budesonide 0.5 mg/2 ml inhalation suspension   inhaled     PRESCRIPTIONS: ELECTRONICALLY SUBMITTED  STOP TAKING THE FOLLOWING MEDICATION(S):    acetaminophen-oxycodone 325 mg-5 mg oral tablet: 1 tab(s) orally every 6 hours, As needed, pain pioglitazone 30 mg oral tablet: 1 tab(s) orally once a day docusate-senna 50 mg-8.6 mg oral tablet: 1 tab(s) orally prn, As Needed  Physician's Instructions:  Diet Low Sodium  Carbohydrate Controlled (ADA) Diet   Activity Limitations As tolerated   Return to Work Not Applicable   Time frame for Follow Up Appointment 1-2 weeks  [facility]   Time frame for Follow Up Appointment 1-2 weeks  [facility]   Time frame for Follow Up Appointment 1-2 weeks  Dr. Leotis Shames   Electronic Signatures: Leotis Shames (MD)  (Signed 16-Apr-15 14:07)  Authored: ADMISSION DATE AND DIAGNOSIS, CHIEF COMPLAINT/HPI, Allergies, PERTINENT LABS, PERTINENT RADIOLOGY STUDIES, PERTINENT PAST HISTORY, HOSPITAL COURSE, DISCHARGE INSTRUCTIONS HOME MEDS, PATIENT INSTRUCTIONS   Last Updated:  16-Apr-15 14:07 by Leotis Shames (MD)

## 2014-05-08 NOTE — H&P (Signed)
PATIENT NAME:  Carrie Shea, STAKES MR#:  [id number] DATE OF BIRTH:  02/06/1931  DATE OF ADMISSION:  01/01/2011  PRIMARY CARE PHYSICIAN: Lamonte Sakai, MD/Martin Audie Clear with [facility]  CHIEF COMPLAINT: The patient is not feeling overall well.   HISTORY OF PRESENT ILLNESS: Carrie Shea is a 81 year old Caucasian female who is a direct admit from [place] this morning with complaints of increasing weakness, cough which is dry and hacking, and shortness of breath. She was seen at the primary care's office on the 13th of December and was diagnosed to have right lower lobe community-acquired pneumonia. She was started on Avelox, and the patient continued to take. She has completed five days of Avelox; however, she continued to have swimmy head, not feeling better, cough not responding much. She was also found to have saturations down in the 80s today in the primary care physician's office; hence, she is being admitted directly for failed outpatient pneumonia treatment.   PAST MEDICAL HISTORY:  1. Atrial fibrillation, on Coumadin and Multaq.  2. Palpitations due to frequent PACs and PVCs.  3. Mitral regurgitation, moderate.  4. Essential hypertension.  5. History of PE.  6. Gastroesophageal reflux disease.  7. Chronic renal failure. Creatinine is 2 to 2.5.  8. Type 2 diabetes.  9. Hyperlipidemia.  10. Obesity.   PAST SURGICAL HISTORY:  1. Hysterectomy.  2. Cholecystectomy.  3. Bilateral cataract surgery.   SOCIAL HISTORY: She lives at home. She used to smoke for many years. She started smoking in her 62s and quit in 2000. She is currently not a smoker. She denies any alcohol use. She is a widow.   ALLERGIES: Sulfa and tape.   FAMILY HISTORY: Mother and father deceased, heart disease present in the family.   MEDICATIONS:  1. Benicar 5 mg p.o. daily.  2. Fish oil 3000 mg p.o. daily.  3. Lasix 20 mg as needed.  4. Garlic 6301 mg b.i.d.    5. Glipizide 10 mg b.i.d.   6. Klor-Con 10 mEq p.o. daily.  7. Lipitor 10 mg daily.  8. Multaq 400 mg b.i.d.   9. Multivitamin p.o. daily.  10. Oxycodone/acetaminophen 1 tablet as needed.  11. TriCor 145 mg daily.  12. Uloric 40 mg daily.  13. Vitamin C 500 mg daily.  14. Vitamin D 2000 units p.o. daily.  15. Warfarin sodium 3.0 mg daily.    REVIEW OF SYSTEMS: CONSTITUTIONAL: No fever. Positive for fatigue, weakness. EYES: No blurred or double vision. ENT: No tinnitus, ear pain, hearing loss. RESPIRATORY: Positive for cough, dry, along with shortness of breath. No hemoptysis. CARDIOVASCULAR: No chest pain. Positive for hypertension and chronic paroxysmal atrial fibrillation. GI: No nausea, vomiting, diarrhea, abdominal pain. GU: No dysuria or hematuria. ENDOCRINE: No polyuria or nocturia. HEMATOLOGY: No anemia or easy bruising. SKIN: No acne or rash. MUSCULOSKELETAL: Positive for arthritis. NEUROLOGICAL:  No cerebrovascular accident or transient ischemic attack.   PSYCHIATRIC: No anxiety or depression. All other systems reviewed and negative.   LABORATORY DATA: The patient's stat labs, CBC, blood cultures and metabolic panel, along with PT- INR are pending, even influenza A and B are pending.   ASSESSMENT: Carrie Shea is a 81 year old Caucasian female with: 1. Right lower lobe pneumonia, failed outpatient treatment.  2. Chronic obstructive pulmonary disease flare, new diagnosis, with history of smoking for many years, quit in 2000.  3. Hypoxia with saturations in the 80s.  4. Chronic paroxysmal atrial fibrillation, on Multaq and Coumadin.  5. Hypertension.  6.  History of pulmonary embolus.  7. Type 2 diabetes.  8. Chronic kidney disease, stage III. The patient's baseline creatinine is around 2.0 to 2.5.   PLAN:  1. Admit the patient to a medical floor.  2. A 2 grams sodium, ADA 1800 calorie renal diet.  3. We will check stat labs, blood cultures, CBC, Met-B and PT. Check influenza A and B antigen. We will  give her a bag of IV fluids.  4. We will start the patient on Rocephin and Zithromax , continue all home medications except Lasix. I will send sputum for culture.  5. Nebulizer treatment around-the-clock as needed. I will start the patient also on a low-dose prednisone taper.  6. Check PT/INR and adjust Coumadin accordingly.  7. Further work-up according to the patient's clinical course.   The hospital admission plan was discussed with the patient, who is agreeable to it. The patient will be followed by Wilnette Kales from tomorrow.   TIME SPENT: 50 minutes.  ____________________________ Hart Rochester Posey Pronto, MD sap:cbb D: 01/01/2011 14:44:40 ET T: 01/01/2011 15:04:27 ET JOB#: [id number]  cc: Daurice Ovando A. Posey Pronto, MD, <Dictator> Lamonte Sakai, MD/Martin G. Prudence Davidson, [place] Ilda Basset MD ELECTRONICALLY SIGNED 01/17/2011 14:40

## 2014-05-08 NOTE — Discharge Summary (Signed)
PATIENT NAME:  Carrie Shea, Carrie Shea MR#:  [id number] DATE OF BIRTH:  09-13-1931  DATE OF ADMISSION:  01/01/2011 DATE OF DISCHARGE:  01/07/2011  DISCHARGE DIAGNOSES:  1. Pneumonia.  2. Chronic obstructive pulmonary disease.   SECONDARY DIAGNOSES (NOT NEW):  1. Chronic paroxysmal atrial fibrillation. 2. Chronic renal insufficiency, stage IV. 3. Chronic edema. 4. Chronic diarrhea. 5. Chronic pain secondary to osteoarthritis. 6. Hypertension. 7. Hyperlipidemia. 8. Diabetes mellitus, type II.   DISCHARGE MEDICATIONS: The patient is to resume all home medications. Please see the patient's discharge instructions for full details in this regard.   Additionally, the patient is to begin taking:   1. Spiriva, inhale the contents of 1 capsule once a day (for full effect, do two inhalations for every one capsule).   HOSPITAL COURSE: This is a 81 year old Caucasian female who was directly admitted from [facility] on the date of admission with complaints of increasing weakness, cough which was dry and hacking, mild altered mental status, and shortness of breath. She was initially seen at [facility] on December 13th and was diagnosed with a right lower lobe pneumonia. She was started on Avelox and was followed closely as an outpatient. However, on the day of admission, she was seen at [facility] and noted to have oxygen saturations in the 80s and also was noted to have mild altered mental status and the other complaints as aforementioned; thus, she was deemed to have failed outpatient treatment and was admitted directly to the hospital.   On admission to the hospital, the patient was placed on 2 liters of oxygen via nasal cannula. Her antibiotic regimen was switched from Avelox to IV ceftriaxone and azithromycin. The patient was also given gentle IV fluid rehydration. She also received DuoNebs as needed, cough suppressants as needed, and expectorants as  needed. She was given steroids by mouth for her concomitant COPD flare. Her home medications were continued, with the exception of her furosemide, which she uses sparingly on a PRN basis apparently as  per her cardiologist for her chronic lower extremity edema, which remained stable during her hospitalization.   Her other medical comorbidities also remained stable during her hospitalization.  She had a follow-up chest x-ray while in the hospital, and it demonstrated resolution of her pneumonia. Together, she received 5 days of Avelox, and then subsequently (upon being hospitalized) received 5 days of azithromycin and 7 days of ceftriaxone. Given the total duration of her antibiotic regimen and the finding of resolution of her pneumonia of follow-up chest x-ray, she was discharged without any further antibiotics.  The patient did rapidly improve upon her treatment in the hospital, and on the day of discharge she was deemed fit to return home and was eager to do so.   CONDITION ON DISCHARGE: The patient was discharged from the hospital in satisfactory condition.   DIET: Low salt, ADA, renal diet.   ACTIVITY: As tolerated.   FOLLOW-UP: Follow-up visit within 5 to 7 days with Dr. Yves DillNeelam Khan of [facility].   Discharge time spent including coordination of patient care, education, and the like was greater than 30 minutes.   ____________________________ Burnett HarryMartin G. Shaune SpittleMayer, PA-C, MSPAS mgm:drc D: 01/06/2011 17:21:25 ET T: 01/07/2011 13:54:41 ET JOB#: [id number]  cc: Burnett HarryMartin G. Shaune SpittleMayer, PA-C, MSPAS, <Dictator> Margaretann LovelessNeelam S. Khan, MD Burnett HarryMARTIN G Derec Mozingo PA ELECTRONICALLY SIGNED 01/09/2011 12:23 Charlesetta GaribaldiSHEIKH A TEJAN-SIE MD ELECTRONICALLY SIGNED 02/15/2011 12:15
